# Patient Record
Sex: Male | Born: 1960 | Race: Black or African American | Hispanic: No | Marital: Single
Health system: Southern US, Community
[De-identification: ages and names within clinical notes are randomized; demographics above are authoritative.]

## PROBLEM LIST (undated history)

## (undated) DIAGNOSIS — F209 Schizophrenia, unspecified: Secondary | ICD-10-CM

## (undated) DIAGNOSIS — I10 Essential (primary) hypertension: Secondary | ICD-10-CM

## (undated) HISTORY — PX: APPENDECTOMY: SHX54

---

## 2011-10-06 DIAGNOSIS — F911 Conduct disorder, childhood-onset type: Secondary | ICD-10-CM | POA: Insufficient documentation

## 2011-10-06 DIAGNOSIS — F17200 Nicotine dependence, unspecified, uncomplicated: Secondary | ICD-10-CM | POA: Insufficient documentation

## 2011-10-07 ENCOUNTER — Encounter (HOSPITAL_COMMUNITY): Payer: Self-pay

## 2011-10-07 ENCOUNTER — Emergency Department (HOSPITAL_COMMUNITY)
Admission: EM | Admit: 2011-10-07 | Discharge: 2011-10-07 | Disposition: A | Discharge status: Home / Self Care | Payer: Medicare Other | Attending: Emergency Medicine | Admitting: Emergency Medicine

## 2011-10-07 DIAGNOSIS — R454 Irritability and anger: Secondary | ICD-10-CM

## 2011-10-07 HISTORY — DX: Schizophrenia, unspecified: F20.9

## 2011-10-07 LAB — COMPREHENSIVE METABOLIC PANEL
ALT: 13 U/L (ref 0–53)
AST: 19 U/L (ref 0–37)
Alkaline Phosphatase: 128 U/L — ABNORMAL HIGH (ref 39–117)
CO2: 26 mEq/L (ref 19–32)
Calcium: 9.9 mg/dL (ref 8.4–10.5)
Chloride: 97 mEq/L (ref 96–112)
GFR calc Af Amer: >90 mL/min (ref >90)
GFR calc non Af Amer: >90 mL/min (ref >90)
Glucose, Bld: 118 mg/dL — ABNORMAL HIGH (ref 70–99)
Sodium: 134 mEq/L — ABNORMAL LOW (ref 135–145)
Total Bilirubin: 0.3 mg/dL (ref 0.3–1.2)

## 2011-10-07 LAB — CBC
Hemoglobin: 13.6 g/dL (ref 13.0–17.0)
MCH: 26.3 pg (ref 26.0–34.0)
Platelets: 396 10*3/uL (ref 150–400)
RBC: 5.17 MIL/uL (ref 4.22–5.81)
WBC: 10.1 10*3/uL (ref 4.0–10.5)

## 2011-10-07 LAB — RAPID URINE DRUG SCREEN, HOSP PERFORMED
Amphetamines: NOT DETECTED
Barbiturates: NOT DETECTED
Opiates: NOT DETECTED
Tetrahydrocannabinol: NOT DETECTED

## 2011-10-07 NOTE — ED Notes (Signed)
Pt is getting a ride from [employer] to leave,  but now pt has IVC papers taken out by his mother. Pt. will not be able to leave the facility.
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
## 2011-10-07 NOTE — ED Provider Notes (Signed)
History     CSN: [id number]  Arrival date & time 10/06/11  2313   First MD Initiated Contact with Patient 10/07/11 0044      Chief Complaint  Patient presents with  . Medical Clearance    (Consider location/radiation/quality/duration/timing/severity/associated sxs/prior treatment) The history is provided by the patient.   51 year old male states that he got into an argument with his mother. He is angry about being disrespected in his own home. He denies making any threats to anyone. He denies homicidal or suicidal ideation. He denies depression. He states that he really does need some resources for outpatient counseling. He denies hallucinations.  No past medical history on file.  No past surgical history on file.  No family history on file.  History  Substance Use Topics  . Smoking status: Current Everyday Smoker  . Smokeless tobacco: Not on file  . Alcohol Use: No      Review of Systems  All other systems reviewed and are negative.    Allergies  Review of patient's allergies indicates no known allergies.  Home Medications  No current outpatient prescriptions on file.  BP 150/88  Pulse 126  Temp 98.3 F (36.8 C) (Oral)  SpO2 95%  Physical Exam  Nursing note and vitals reviewed. -year-old male comfortable in no acute distress. Vital signs are significant for tachycardia with heart rate of 126, and hypertension with blood pressure 150/88. Oxygen saturation is 95% which is normal. Head is normocephalic and atraumatic. PERRLA, EOMI. Supple. Back is nontender. Lungs are clear without rales, wheezes, rhonchi. Heart has regular rate rhythm without murmur. Abdomen is soft, flat, nontender without masses or hepatosplenomegaly. Jimmy's have no cyanosis or edema, full range of motion is present. Rash. Neurologic: Mental status is normal, cranial nerves are intact, there are no motor or sensory deficits.  ED Course  Procedures (including critical care time)  Results for  orders placed during the hospital encounter of 10/07/11  CBC      Component Value Range   WBC 10.1  4.0 - 10.5 K/uL   RBC 5.17  4.22 - 5.81 MIL/uL   Hemoglobin 13.6  13.0 - 17.0 g/dL   HCT 47.8  29.5 - 62.1 %   MCV 79.3  78.0 - 100.0 fL   MCH 26.3  26.0 - 34.0 pg   MCHC 33.2  30.0 - 36.0 g/dL   RDW 30.8  65.7 - 84.6 %   Platelets 396  150 - 400 K/uL  COMPREHENSIVE METABOLIC PANEL      Component Value Range   Sodium 134 (*) 135 - 145 mEq/L   Potassium 3.8  3.5 - 5.1 mEq/L   Chloride 97  96 - 112 mEq/L   CO2 26  19 - 32 mEq/L   Glucose, Bld 118 (*) 70 - 99 mg/dL   BUN 10  6 - 23 mg/dL   Creatinine, Ser 9.62  0.50 - 1.35 mg/dL   Calcium 9.9  8.4 - 95.2 mg/dL   Total Protein 8.5 (*) 6.0 - 8.3 g/dL   Albumin 4.1  3.5 - 5.2 g/dL   AST 19  0 - 37 U/L   ALT 13  0 - 53 U/L   Alkaline Phosphatase 128 (*) 39 - 117 U/L   Total Bilirubin 0.3  0.3 - 1.2 mg/dL   GFR calc non Af Amer >90  >90 mL/min   GFR calc Af Amer >90  >90 mL/min  ETHANOL      Component Value Range  Alcohol, Ethyl (B) <11  0 - 11 mg/dL  ACETAMINOPHEN LEVEL      Component Value Range   Acetaminophen (Tylenol), Serum <15.0  10 - 30 ug/mL  URINE RAPID DRUG SCREEN (HOSP PERFORMED)      Component Value Range   Opiates NONE DETECTED  NONE DETECTED   Cocaine NONE DETECTED  NONE DETECTED   Benzodiazepines NONE DETECTED  NONE DETECTED   Amphetamines NONE DETECTED  NONE DETECTED   Tetrahydrocannabinol NONE DETECTED  NONE DETECTED   Barbiturates NONE DETECTED  NONE DETECTED    Date: 10/07/2011  Rate: 115  Rhythm: sinus tachycardia  QRS Axis: normal  Intervals: normal  ST/T Wave abnormalities: nonspecific T wave changes  Conduction Disutrbances:none  Narrative Interpretation: Sinus tachycardia, left atrial hypertrophy, nonspecific T wave changes. No old ECG available for comparison.  Old EKG Reviewed: none available   1. Anger reaction       MDM  Situational anxiety without evidence of psychosis. No homicidal  or suicidal ideation. He need for hospitalization. Patient is requesting referral for counseling resources, so consultation will be obtained with ACT Team.        Dione Booze, MD 10/07/11 [phone]
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
## 2011-10-07 NOTE — ED Notes (Signed)
Pt's mother called and states that she is not comfortable for pt to return home in the state he is currently in Mother states pt told her he doesn't want her other children at the house and that it is his house Mother states pt is schizophrenic and is not acting per his norm and she feels it is unsafe for the pt to return at this time  EDP notified  Order for social service consult obtained

## 2011-10-07 NOTE — ED Notes (Signed)
Pt denies SI/HI reports "just got mad at my family.  Family informed officer that he had not been taking his medication- Pt is unsure of medications and poor historian

## 2011-10-07 NOTE — ED Notes (Signed)
RN spoke with Dr Preston Fleeting regarding IVC papers, Dr Preston Fleeting states pt has been cleared (both medical and psy) [employer] officer trying to verify pt can be discharged and papers resinded.

## 2014-11-12 ENCOUNTER — Ambulatory Visit
Admission: RE | Admit: 2014-11-12 | Discharge: 2014-11-12 | Disposition: A | Discharge status: Home / Self Care | Payer: Medicare Other | Source: Ambulatory Visit | Attending: Internal Medicine | Admitting: Internal Medicine

## 2014-11-12 ENCOUNTER — Other Ambulatory Visit: Payer: Self-pay | Admitting: Internal Medicine

## 2014-11-12 DIAGNOSIS — J40 Bronchitis, not specified as acute or chronic: Secondary | ICD-10-CM

## 2015-05-05 ENCOUNTER — Encounter (HOSPITAL_COMMUNITY): Payer: Self-pay

## 2015-05-05 ENCOUNTER — Emergency Department (HOSPITAL_COMMUNITY)
Admission: EM | Admit: 2015-05-05 | Discharge: 2015-05-10 | Disposition: A | Discharge status: Home / Self Care | Payer: Medicare Other | Attending: Emergency Medicine | Admitting: Emergency Medicine

## 2015-05-05 DIAGNOSIS — G4700 Insomnia, unspecified: Secondary | ICD-10-CM | POA: Insufficient documentation

## 2015-05-05 DIAGNOSIS — F17200 Nicotine dependence, unspecified, uncomplicated: Secondary | ICD-10-CM | POA: Diagnosis not present

## 2015-05-05 DIAGNOSIS — F209 Schizophrenia, unspecified: Secondary | ICD-10-CM | POA: Insufficient documentation

## 2015-05-05 DIAGNOSIS — Z79899 Other long term (current) drug therapy: Secondary | ICD-10-CM | POA: Insufficient documentation

## 2015-05-05 DIAGNOSIS — F419 Anxiety disorder, unspecified: Secondary | ICD-10-CM | POA: Insufficient documentation

## 2015-05-05 DIAGNOSIS — Z9119 Patient's noncompliance with other medical treatment and regimen: Secondary | ICD-10-CM | POA: Diagnosis not present

## 2015-05-05 DIAGNOSIS — Z7982 Long term (current) use of aspirin: Secondary | ICD-10-CM | POA: Insufficient documentation

## 2015-05-05 DIAGNOSIS — Z9114 Patient's other noncompliance with medication regimen: Secondary | ICD-10-CM

## 2015-05-05 DIAGNOSIS — F919 Conduct disorder, unspecified: Secondary | ICD-10-CM | POA: Diagnosis present

## 2015-05-05 DIAGNOSIS — R4689 Other symptoms and signs involving appearance and behavior: Secondary | ICD-10-CM

## 2015-05-05 LAB — COMPREHENSIVE METABOLIC PANEL WITH GFR
ALT: 20 U/L (ref 17–63)
AST: 92 U/L — ABNORMAL HIGH (ref 15–41)
Albumin: 3.7 g/dL (ref 3.5–5.0)
Alkaline Phosphatase: 78 U/L (ref 38–126)
Anion gap: 8 (ref 5–15)
BUN: 19 mg/dL (ref 6–20)
CO2: 28 mmol/L (ref 22–32)
Calcium: 9 mg/dL (ref 8.9–10.3)
Chloride: 102 mmol/L (ref 101–111)
Creatinine, Ser: 1 mg/dL (ref 0.61–1.24)
GFR calc Af Amer: >60 mL/min
GFR calc non Af Amer: >60 mL/min
Glucose, Bld: 112 mg/dL — ABNORMAL HIGH (ref 65–99)
Potassium: 4 mmol/L (ref 3.5–5.1)
Sodium: 138 mmol/L (ref 135–145)
Total Bilirubin: 0.5 mg/dL (ref 0.3–1.2)
Total Protein: 8.2 g/dL — ABNORMAL HIGH (ref 6.5–8.1)

## 2015-05-05 LAB — SALICYLATE LEVEL

## 2015-05-05 LAB — CBC
HCT: 44.2 % (ref 39.0–52.0)
Hemoglobin: 14.2 g/dL (ref 13.0–17.0)
MCH: 26.9 pg (ref 26.0–34.0)
MCHC: 32.1 g/dL (ref 30.0–36.0)
MCV: 83.9 fL (ref 78.0–100.0)
PLATELETS: 435 10*3/uL — AB (ref 150–400)
RBC: 5.27 MIL/uL (ref 4.22–5.81)
RDW: 14.7 % (ref 11.5–15.5)
WBC: 8 10*3/uL (ref 4.0–10.5)

## 2015-05-05 LAB — ETHANOL: Alcohol, Ethyl (B): <5 mg/dL

## 2015-05-05 LAB — ACETAMINOPHEN LEVEL: Acetaminophen (Tylenol), Serum: <10 ug/mL — ABNORMAL LOW (ref 10–30)

## 2015-05-05 MED ORDER — ASPIRIN 81 MG PO CHEW
81.0000 mg | CHEWABLE_TABLET | Freq: Every day | ORAL | Status: DC
  Administered 2015-05-05 – 2015-05-10 (×6): 81 mg via ORAL
  Filled 2015-05-05 (×6): qty 1

## 2015-05-05 MED ORDER — LISINOPRIL 10 MG PO TABS
10.0000 mg | ORAL_TABLET | Freq: Every day | ORAL | Status: DC
  Administered 2015-05-06 – 2015-05-10 (×5): 10 mg via ORAL
  Filled 2015-05-05 (×5): qty 1
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
## 2015-05-05 NOTE — ED Notes (Signed)
Bed: WBH38 Expected date:  Expected time:  Means of arrival:  Comments: Triage 3 
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
## 2015-05-05 NOTE — ED Notes (Signed)
Pt AAO x 3, resting at present, no distress noted, calm & cooperative, denies SI, HI or AVH.  Monitoring for safety, Q 15 min checks in effect.
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
## 2015-05-05 NOTE — ED Notes (Signed)
Pt has been changed into scrubs and wanded by Security.  
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
## 2015-05-06 DIAGNOSIS — F209 Schizophrenia, unspecified: Secondary | ICD-10-CM

## 2015-05-06 LAB — RAPID URINE DRUG SCREEN, HOSP PERFORMED
AMPHETAMINES: NOT DETECTED
BARBITURATES: NOT DETECTED
Benzodiazepines: NOT DETECTED
Cocaine: NOT DETECTED
Opiates: NOT DETECTED
Tetrahydrocannabinol: NOT DETECTED

## 2015-05-06 MED ORDER — RISPERIDONE 1 MG PO TABS
1.0000 mg | ORAL_TABLET | Freq: Every day | ORAL | Status: DC
  Administered 2015-05-06 – 2015-05-09 (×4): 1 mg via ORAL
  Filled 2015-05-06 (×4): qty 1

## 2015-05-06 NOTE — ED Notes (Signed)
Pt is pleasant and cooperative.  He denies S/I, H/I. And AVH.  He is mostly withdrawn and sleeping today. 15 minute checks and video monitoring continue.
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
## 2015-05-06 NOTE — Consult Note (Signed)
**Note Paul-Identified via Obfuscation** [facility] Consult   Reason for Consult:  Psychiatric Evaluation Referring Physician:  EDP Patient Identification: Paul Mejia MRN:  [id number] Principal Diagnosis: Schizophrenia Diagnosis:  There are no active problems to display for this patient.   Total Time spent with patient: 45 minutes  Subjective:   Paul Mejia is a 55 y.o. male patient who states "I have not been taking my medications. I don't feel right taking them."  HPI:  Paul Mejia is a 55 year old African-American male who presented to [facility] emergency department after he was placed under involuntary commitment by his family due to being off this medication for schizophrenia. Family states that since been off his medication. He has not been sleeping and has poor personal hygiene. His family is concerned about him not taking his medications. Per the IVC paperwork, the patient has been aggressive with family members, throwing things waking up startled and swinging at people.  Today, the patient states that he is supposed to be on medication for schizophrenia. However, he does not like taking the medicine, he reports he has been on medication for 21 years and "I do not feel like I should be on medicine for the rest of my life." States he has taken Risperdal and Haldol in the past but has not taken any of his medication in the past 6-8 months. Patient denies any aggressive behavior that was referenced by the family, stating "I don't bother nobody."  Today he denies suicidal ideation, intent or plan. He denies homicidal ideation, intent and plan. He does endorse visual and auditory hallucinations.  Past Psychiatric History: Schizophrenia  Risk to Self: Suicidal Ideation: No Suicidal Intent: No Is patient at risk for suicide?: No Suicidal Plan?: No Access to Means: No What has been your use of drugs/alcohol within the last 12 months?: None Reported How many times?: 0 Other Self Harm Risks: None  Reported Intentional Self Injurious Behavior: None Risk to Others: Homicidal Ideation: No Thoughts of Harm to Others: No Current Homicidal Intent: No Current Homicidal Plan: No Access to Homicidal Means: No Identified Victim: None Reported History of harm to others?: No Assessment of Violence: None Noted Violent Behavior Description: None  Does patient have access to weapons?: No Criminal Charges Pending?: No Does patient have a court date: No Prior Inpatient Therapy: Prior Inpatient Therapy: Yes Prior Therapy Dates: 10 years ago Prior Therapy Facilty/Provider(s): Unable to remember Reason for Treatment: Unable to remember Prior Outpatient Therapy: Prior Outpatient Therapy: No Prior Therapy Dates: NA Prior Therapy Facilty/Provider(s): NA Reason for Treatment: NA Does patient have an ACCT team?: No Does patient have Intensive In-House Services?  : No Does patient have [facility] services? : No Does patient have P4CC services?: No  Past Medical History:  Past Medical History  Diagnosis Date  . Schizophrenia ([place])    History reviewed. No pertinent past surgical history. Family History: History reviewed. No pertinent family history. Family Psychiatric  History: Unknown Social History:  History  Alcohol Use No     History  Drug Use No    Social History   Social History  . Marital Status: Single    Spouse Name: N/A  . Number of Children: N/A  . Years of Education: N/A   Social History Main Topics  . Smoking status: Current Every Day Smoker  . Smokeless tobacco: None  . Alcohol Use: No  . Drug Use: No  . Sexual Activity: Not Asked   Other Topics Concern  . None   Social History Narrative  Additional Social History:    Allergies:  No Known Allergies  Labs:  Results for orders placed or performed during the hospital encounter of 05/05/15 (from the past 48 hour(s))  Comprehensive metabolic panel     Status: Abnormal   Collection Time: 05/05/15  4:57 PM   Result Value Ref Range   Sodium 138 135 - 145 mmol/L   Potassium 4.0 3.5 - 5.1 mmol/L   Chloride 102 101 - 111 mmol/L   CO2 28 22 - 32 mmol/L   Glucose, Bld 112 (H) 65 - 99 mg/dL   BUN 19 6 - 20 mg/dL   Creatinine, Ser 1.00 0.61 - 1.24 mg/dL   Calcium 9.0 8.9 - 10.3 mg/dL   Total Protein 8.2 (H) 6.5 - 8.1 g/dL   Albumin 3.7 3.5 - 5.0 g/dL   AST 92 (H) 15 - 41 U/L   ALT 20 17 - 63 U/L   Alkaline Phosphatase 78 38 - 126 U/L   Total Bilirubin 0.5 0.3 - 1.2 mg/dL   GFR calc non Af Amer >60 >60 mL/min   GFR calc Af Amer >60 >60 mL/min    Comment: (NOTE) The eGFR has been calculated using the CKD EPI equation. This calculation has not been validated in all clinical situations. eGFR's persistently <60 mL/min signify possible Chronic Kidney Disease.    Anion gap 8 5 - 15  Ethanol (ETOH)     Status: None   Collection Time: 05/05/15  4:57 PM  Result Value Ref Range   Alcohol, Ethyl (B) <5 <5 mg/dL    Comment:        LOWEST DETECTABLE LIMIT FOR SERUM ALCOHOL IS 5 mg/dL FOR MEDICAL PURPOSES ONLY   Salicylate level     Status: None   Collection Time: 05/05/15  4:57 PM  Result Value Ref Range   Salicylate Lvl <1.2 2.8 - 30.0 mg/dL  Acetaminophen level     Status: Abnormal   Collection Time: 05/05/15  4:57 PM  Result Value Ref Range   Acetaminophen (Tylenol), Serum <10 (L) 10 - 30 ug/mL    Comment:        THERAPEUTIC CONCENTRATIONS VARY SIGNIFICANTLY. A RANGE OF 10-30 ug/mL MAY BE AN EFFECTIVE CONCENTRATION FOR MANY PATIENTS. HOWEVER, SOME ARE BEST TREATED AT CONCENTRATIONS OUTSIDE THIS RANGE. ACETAMINOPHEN CONCENTRATIONS >150 ug/mL AT 4 HOURS AFTER INGESTION AND >50 ug/mL AT 12 HOURS AFTER INGESTION ARE OFTEN ASSOCIATED WITH TOXIC REACTIONS.   CBC     Status: Abnormal   Collection Time: 05/05/15  4:57 PM  Result Value Ref Range   WBC 8.0 4.0 - 10.5 K/uL   RBC 5.27 4.22 - 5.81 MIL/uL   Hemoglobin 14.2 13.0 - 17.0 g/dL   HCT 44.2 39.0 - 52.0 %   MCV 83.9 78.0 - 100.0  fL   MCH 26.9 26.0 - 34.0 pg   MCHC 32.1 30.0 - 36.0 g/dL   RDW 14.7 11.5 - 15.5 %   Platelets 435 (H) 150 - 400 K/uL  Urine rapid drug screen (hosp performed) (Not at [facility])     Status: None   Collection Time: 05/06/15  6:00 AM  Result Value Ref Range   Opiates NONE DETECTED NONE DETECTED   Cocaine NONE DETECTED NONE DETECTED   Benzodiazepines NONE DETECTED NONE DETECTED   Amphetamines NONE DETECTED NONE DETECTED   Tetrahydrocannabinol NONE DETECTED NONE DETECTED   Barbiturates NONE DETECTED NONE DETECTED    Comment:        DRUG SCREEN FOR MEDICAL PURPOSES  ONLY.  IF CONFIRMATION IS NEEDED FOR ANY PURPOSE, NOTIFY LAB WITHIN 5 DAYS.        LOWEST DETECTABLE LIMITS FOR URINE DRUG SCREEN Drug Class       Cutoff (ng/mL) Amphetamine      1000 Barbiturate      200 Benzodiazepine   322 Tricyclics       025 Opiates          300 Cocaine          300 THC              50     Current Facility-Administered Medications  Medication Dose Route Frequency Provider Last Rate Last Dose  . aspirin chewable tablet 81 mg  81 mg Oral Daily Leo Grosser, MD   81 mg at 05/06/15 1037  . lisinopril (PRINIVIL,ZESTRIL) tablet 10 mg  10 mg Oral Daily Leo Grosser, MD   10 mg at 05/06/15 1037   Current Outpatient Prescriptions  Medication Sig Dispense Refill  . aspirin 81 MG chewable tablet Chew 81 mg by mouth daily.    Marland Kitchen lisinopril (PRINIVIL,ZESTRIL) 10 MG tablet Take 10 mg by mouth daily.    . simvastatin (ZOCOR) 20 MG tablet Take 20 mg by mouth daily.       Musculoskeletal: Strength & Muscle Tone: within normal limits Gait & Station: normal Patient leans: N/A  Psychiatric Specialty Exam: Review of Systems  Constitutional: Negative.   HENT: Negative.   Eyes: Negative.   Respiratory: Negative.   Cardiovascular: Negative.   Gastrointestinal: Negative.   Genitourinary: Negative.   Musculoskeletal: Negative.   Skin: Negative.   Endo/Heme/Allergies: Negative.   Psychiatric/Behavioral:  Positive for hallucinations.    Blood pressure 115/78, pulse 82, temperature 98.1 F (36.7 C), temperature source Oral, resp. rate 16, SpO2 98 %.There is no height or weight on file to calculate BMI.  General Appearance: Disheveled  Eye Sport and exercise psychologist::  Fair  Speech:  Clear and Coherent and Normal Rate  Volume:  Normal  Mood:  Anxious  Affect:  Congruent  Thought Process:  Coherent  Orientation:  Full (Time, Place, and Person)  Thought Content:  Hallucinations: Auditory Visual  Suicidal Thoughts:  No  Homicidal Thoughts:  No  Memory:  Immediate;   Fair Recent;   Fair Remote;   Fair  Judgement:  Fair  Insight:  Fair  Psychomotor Activity:  Normal  Concentration:  Fair  Recall:  [employer] of Knowledge:Fair  Language: Fair  Akathisia:  No  Handed:  Right  AIMS (if indicated):     Assets:  Communication Skills Physical Health Resilience  ADL's:  Intact  Cognition: WNL  Sleep:      Treatment Plan Summary: Daily contact with patient to assess and evaluate symptoms and progress in treatment and Medication management  -Start Risperdal 1 mg PO QHS for schizophrenia  Disposition: Recommend psychiatric Inpatient admission when medically cleared.  Lurena Nida, NP 05/06/2015 3:30 PM

## 2015-05-06 NOTE — ED Notes (Signed)
Patient received calm and resting in bed. Patient denies SI/ HI, A/ V H, and pain at this time. Patient reports having slept. Patient reports no needs at this time. Monitoring for safety, 15 min checks continue.

## 2015-05-07 DIAGNOSIS — F209 Schizophrenia, unspecified: Secondary | ICD-10-CM | POA: Diagnosis not present

## 2015-05-07 NOTE — ED Notes (Signed)
Report received from Diane RN. Pt. Sleeping with respirations regular and unlabored. Will continue to monitor for safety via security cameras and Q 15 minute checks. 
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
## 2015-05-07 NOTE — ED Notes (Signed)
Pt has been in bed sleeping all day. He becomes alert when someone comes into his room, and is pleasant and cooperative. Denies SI/HI.

## 2015-05-07 NOTE — ED Notes (Signed)
Pt. Noted sleeping in room. No complaints or concerns voiced. No distress or abnormal behavior noted. Will continue to monitor with security cameras. Q 15 minute rounds continue. 

## 2015-05-08 DIAGNOSIS — F209 Schizophrenia, unspecified: Secondary | ICD-10-CM | POA: Diagnosis present

## 2015-05-08 NOTE — ED Notes (Signed)
Pt. Noted sleeping in room. No complaints or concerns voiced. No distress or abnormal behavior noted. Will continue to monitor with security cameras. Q 15 minute rounds continue. 

## 2015-05-08 NOTE — Progress Notes (Signed)
Disposition CSW completed patient referrals to the following inpatient psych facilities:  [facility]  CSW will follow up with patient as needed for placement.  Seward SpeckLeo Cutberto Winfree Swain Community HospitalCSW,LCAS Behavioral Health Disposition CSW [phone]

## 2015-05-08 NOTE — ED Notes (Signed)
Snack and beverage given. 

## 2015-05-08 NOTE — ED Notes (Signed)
Up to the bathroom 

## 2015-05-08 NOTE — ED Notes (Signed)
Pt. Noted in room. No complaints or concerns voiced. No distress or abnormal behavior noted. Will continue to monitor with security cameras. Q 15 minute rounds continue. 

## 2015-05-08 NOTE — ED Notes (Signed)
Report received from Janie Rambo RN. Pt. Alert and oriented in no distress denies SI, HI, AVH and pain.  Pt. Instructed to come to me with problems or concerns.Will continue to monitor for safety via security cameras and Q 15 minute checks. 

## 2015-05-08 NOTE — Progress Notes (Signed)
Reassessment:  Patient denies SI/HI, A/VH, and other self-injurious behaviors.  Patient reports compliance with medications and unit rules.  Patient reports sleeping and eating good.     Paul Mejia, MSW, Clare CharonLCSW, LCAS [facility] Triage Specialist [phone]

## 2015-05-09 ENCOUNTER — Encounter (HOSPITAL_COMMUNITY): Payer: Self-pay | Admitting: Registered Nurse

## 2015-05-09 DIAGNOSIS — F209 Schizophrenia, unspecified: Secondary | ICD-10-CM | POA: Diagnosis not present
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
## 2015-05-09 NOTE — ED Notes (Signed)
Pt. Noted sleeping in room. No complaints or concerns voiced. No distress or abnormal behavior noted. Will continue to monitor with security cameras. Q 15 minute rounds continue. 

## 2015-05-09 NOTE — ED Notes (Signed)
Up to the bathroom 

## 2015-05-09 NOTE — ED Notes (Signed)
Dr J and Shovon NP into see 

## 2015-05-09 NOTE — ED Notes (Signed)
Pt. Noted in room. No complaints or concerns voiced. No distress or abnormal behavior noted. Will continue to monitor with security cameras. Q 15 minute rounds continue. 

## 2015-05-09 NOTE — ED Notes (Signed)
Snack and beverage given. 

## 2015-05-09 NOTE — ED Notes (Signed)
Pt reports that he is going got be dc'd today.  Pt reports that he feels safe to leave.

## 2015-05-09 NOTE — ED Notes (Signed)
Pt. In the shower .

## 2015-05-09 NOTE — ED Notes (Signed)
Report received from Janie Rambo RN. Pt. Alert and oriented in no distress denies SI, HI, AVH and pain.  Pt. Instructed to come to me with problems or concerns.Will continue to monitor for safety via security cameras and Q 15 minute checks. 

## 2015-05-10 DIAGNOSIS — F209 Schizophrenia, unspecified: Secondary | ICD-10-CM | POA: Diagnosis present

## 2015-05-10 NOTE — ED Notes (Signed)
Pt. Noted sleeping in room. No complaints or concerns voiced. No distress or abnormal behavior noted. Will continue to monitor with security cameras. Q 15 minute rounds continue. 

## 2015-05-10 NOTE — [facility] Suicide Risk Assessment (Signed)
Suicide Risk Assessment  Discharge Assessment   [facility]   Principal Problem: Schizophrenia, unspecified [facility](HCC) Discharge Diagnoses:  Patient Active Problem List   Diagnosis Date Noted  . Schizophrenia, unspecified (HCC) [F20.9] 05/10/2015    Priority: High  . Schizophrenia (HCC) [F20.9]     Total Time spent with patient: 30 minutes   Musculoskeletal: Strength & Muscle Tone: within normal limits Gait & Station: normal Patient leans: N/A  Psychiatric Specialty Exam: Review of Systems  Constitutional: Negative.   HENT: Negative.   Eyes: Negative.   Respiratory: Negative.   Cardiovascular: Negative.   Gastrointestinal: Negative.   Genitourinary: Negative.   Musculoskeletal: Negative.   Skin: Negative.   Neurological: Negative.   Endo/Heme/Allergies: Negative.   Psychiatric/Behavioral: Negative.     Blood pressure 116/79, pulse 98, temperature 97.5 F (36.4 C), temperature source Oral, resp. rate 16, SpO2 100 %.There is no height or weight on file to calculate BMI.  General Appearance: Casual  Eye Contact::  Good  Speech:  Normal Rate  Volume:  Normal  Mood:  Euthymic  Affect:  Congruent  Thought Process:  Coherent  Orientation:  Full (Time, Place, and Person)  Thought Content:  WDL  Suicidal Thoughts:  No  Homicidal Thoughts:  No  Memory:  Immediate;   Good Recent;   Good Remote;   Good  Judgement:  Good  Insight:  Fair  Psychomotor Activity:  Normal  Concentration:  Good  Recall:  Good  Fund of Knowledge:Good  Language: Good  Akathisia:  No  Handed:  Right  AIMS (if indicated):     Assets:  Housing Leisure Time Physical Health Resilience Social Support  ADL's:  Intact  Cognition: WNL  Sleep:       Mental Status Per Nursing Assessment::   On Admission:   Paranois  Demographic Factors:  Male  Loss Factors: NA  Historical Factors: Family history of mental illness or substance abuse  Risk Reduction Factors:    Sense of responsibility to family, Living with another person, especially a relative, Positive social support and Positive therapeutic relationship  Continued Clinical Symptoms:  None  Cognitive Features That Contribute To Risk:  None    Suicide Risk:  Minimal: No identifiable suicidal ideation.  Patients presenting with no risk factors but with morbid ruminations; may be classified as minimal risk based on the severity of the depressive symptoms  Follow-up Information    Schedule an appointment as soon as possible for a visit with [facility].   Specialty:  Behavioral Health   Why:  Tamela OddiJo Hughes   Contact information:   [address] [place] [address] [phone]       Plan Of Care/Follow-up recommendations:  Activity:  as tolerated Diet:  heart healthy diet  LORD, JAMISON, NP 05/10/2015, 2:14 PM

## 2015-05-10 NOTE — BH Assessment (Signed)
[facility] Assessment Progress Note  Per Thedore MinsMojeed Akintayo, MD, this pt does not require psychiatric hospitalization at this time.  Pt presents under IVC initiated by his sister, Herma CarsonSharon Moody, and upheld by Carolanne GrumblingGerald Taylor, MD.  [facility] to follow up with his outpatient provider at [facility].  IVC has been rescinded.  Referral information for [facility] has been included in pt's discharge instructions.  Pt's nurse, Kendal Hymendie, has been notified.  Doylene Canninghomas Taneya Conkel, MA Triage Specialist [phone]

## 2015-05-10 NOTE — Discharge Instructions (Signed)
For your ongoing behavioral health needs, you are advised to follow up with your current provider at [facility]: ° °     [facility] °     [address] °     , [place] [address] °     [phone] °

## 2015-05-10 NOTE — ED Notes (Signed)
Pt discharged ambulatory.  All belongings were returned to patient and discharge instructions were reviewed.
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
## 2015-09-07 ENCOUNTER — Encounter (HOSPITAL_COMMUNITY): Payer: Self-pay | Admitting: *Deleted

## 2015-09-07 ENCOUNTER — Emergency Department (HOSPITAL_COMMUNITY)
Admission: EM | Admit: 2015-09-07 | Discharge: 2015-09-08 | Disposition: A | Discharge status: Rehabilitation Facility | Payer: Medicare Other | Attending: Emergency Medicine | Admitting: Emergency Medicine

## 2015-09-07 ENCOUNTER — Emergency Department (HOSPITAL_COMMUNITY): Payer: Medicare Other

## 2015-09-07 DIAGNOSIS — Z5181 Encounter for therapeutic drug level monitoring: Secondary | ICD-10-CM | POA: Insufficient documentation

## 2015-09-07 DIAGNOSIS — F200 Paranoid schizophrenia: Secondary | ICD-10-CM

## 2015-09-07 DIAGNOSIS — R45850 Homicidal ideations: Secondary | ICD-10-CM | POA: Diagnosis present

## 2015-09-07 DIAGNOSIS — M549 Dorsalgia, unspecified: Secondary | ICD-10-CM | POA: Insufficient documentation

## 2015-09-07 DIAGNOSIS — F17200 Nicotine dependence, unspecified, uncomplicated: Secondary | ICD-10-CM | POA: Diagnosis not present

## 2015-09-07 LAB — COMPREHENSIVE METABOLIC PANEL
ALK PHOS: 92 U/L (ref 38–126)
ALT: 15 U/L — ABNORMAL LOW (ref 17–63)
ANION GAP: 17 — AB (ref 5–15)
AST: 26 U/L (ref 15–41)
Albumin: 4.3 g/dL (ref 3.5–5.0)
BILIRUBIN TOTAL: 0.6 mg/dL (ref 0.3–1.2)
BUN: 8 mg/dL (ref 6–20)
CALCIUM: 9.1 mg/dL (ref 8.9–10.3)
CO2: 16 mmol/L — ABNORMAL LOW (ref 22–32)
Chloride: 100 mmol/L — ABNORMAL LOW (ref 101–111)
Creatinine, Ser: 1.47 mg/dL — ABNORMAL HIGH (ref 0.61–1.24)
GFR calc Af Amer: >60 mL/min (ref >60)
GFR, EST NON AFRICAN AMERICAN: 52 mL/min — AB (ref >60)
Glucose, Bld: 145 mg/dL — ABNORMAL HIGH (ref 65–99)
POTASSIUM: 3.7 mmol/L (ref 3.5–5.1)
Sodium: 133 mmol/L — ABNORMAL LOW (ref 135–145)
TOTAL PROTEIN: 8.6 g/dL — AB (ref 6.5–8.1)

## 2015-09-07 LAB — URINALYSIS, ROUTINE W REFLEX MICROSCOPIC
BILIRUBIN URINE: NEGATIVE
GLUCOSE, UA: NEGATIVE mg/dL
KETONES UR: NEGATIVE mg/dL
LEUKOCYTES UA: NEGATIVE
Nitrite: NEGATIVE
PH: 6 (ref 5.0–8.0)
Protein, ur: 100 mg/dL — AB
Specific Gravity, Urine: 1.014 (ref 1.005–1.030)

## 2015-09-07 LAB — CBC
HEMATOCRIT: 46.5 % (ref 39.0–52.0)
Hemoglobin: 15.5 g/dL (ref 13.0–17.0)
MCH: 26.9 pg (ref 26.0–34.0)
MCHC: 33.3 g/dL (ref 30.0–36.0)
MCV: 80.6 fL (ref 78.0–100.0)
PLATELETS: 441 10*3/uL — AB (ref 150–400)
RBC: 5.77 MIL/uL (ref 4.22–5.81)
RDW: 14.8 % (ref 11.5–15.5)
WBC: 8.7 10*3/uL (ref 4.0–10.5)

## 2015-09-07 LAB — URINE MICROSCOPIC-ADD ON

## 2015-09-07 LAB — CK: Total CK: 174 U/L (ref 49–397)

## 2015-09-07 LAB — RAPID URINE DRUG SCREEN, HOSP PERFORMED
Amphetamines: NOT DETECTED
BARBITURATES: NOT DETECTED
BENZODIAZEPINES: NOT DETECTED
Cocaine: NOT DETECTED
Opiates: NOT DETECTED
Tetrahydrocannabinol: NOT DETECTED

## 2015-09-07 LAB — SALICYLATE LEVEL: Salicylate Lvl: <4 mg/dL (ref 2.8–30.0)

## 2015-09-07 LAB — TROPONIN I

## 2015-09-07 LAB — ETHANOL

## 2015-09-07 LAB — ACETAMINOPHEN LEVEL

## 2015-09-07 MED ORDER — SODIUM CHLORIDE 0.9 % IV BOLUS (SEPSIS)
2000.0000 mL | Freq: Once | INTRAVENOUS | Status: AC
  Administered 2015-09-07: 2000 mL via INTRAVENOUS

## 2015-09-07 MED ORDER — MORPHINE SULFATE (PF) 4 MG/ML IV SOLN
4.0000 mg | Freq: Once | INTRAVENOUS | Status: DC
Start: 2015-09-07 — End: 2015-09-07
  Filled 2015-09-07: qty 1

## 2015-09-07 MED ORDER — IBUPROFEN 200 MG PO TABS
400.0000 mg | ORAL_TABLET | Freq: Once | ORAL | Status: AC
  Administered 2015-09-07: 400 mg via ORAL
  Filled 2015-09-07: qty 2
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
## 2015-09-07 NOTE — ED Notes (Signed)
Pt put in scrubs and belongings are at the nurse's station, pt and belongings have been searched and wanded. Belongings consists of white t shirt, black belt, khaki shorts, black wallet with $1 inside, white and blue socks, and black tennis shoes. Small biohazard bag consisting of his ID, black lighter and a set of keys.

## 2015-09-08 ENCOUNTER — Inpatient Hospital Stay (HOSPITAL_COMMUNITY)
Admission: AD | Admit: 2015-09-08 | Discharge: 2015-09-17 | DRG: 885 | Disposition: A | Discharge status: Home / Self Care | Payer: Medicare Other | Attending: Psychiatry | Admitting: Psychiatry

## 2015-09-08 ENCOUNTER — Encounter (HOSPITAL_COMMUNITY): Payer: Self-pay

## 2015-09-08 DIAGNOSIS — R45850 Homicidal ideations: Secondary | ICD-10-CM | POA: Diagnosis present

## 2015-09-08 DIAGNOSIS — R45851 Suicidal ideations: Secondary | ICD-10-CM | POA: Diagnosis present

## 2015-09-08 DIAGNOSIS — F17200 Nicotine dependence, unspecified, uncomplicated: Secondary | ICD-10-CM | POA: Diagnosis present

## 2015-09-08 DIAGNOSIS — F200 Paranoid schizophrenia: Secondary | ICD-10-CM | POA: Diagnosis present

## 2015-09-08 MED ORDER — HYDROXYZINE HCL 25 MG PO TABS: 25.0000 mg | ORAL_TABLET | Freq: Four times a day (QID) | ORAL | Status: DC | PRN

## 2015-09-08 MED ORDER — MAGNESIUM HYDROXIDE 400 MG/5ML PO SUSP: 30.0000 mL | Freq: Every day | ORAL | Status: DC | PRN

## 2015-09-08 MED ORDER — TRAZODONE HCL 100 MG PO TABS
100.0000 mg | ORAL_TABLET | Freq: Every day | ORAL | Status: DC
  Administered 2015-09-08 – 2015-09-16 (×9): 100 mg via ORAL
  Filled 2015-09-08 (×12): qty 1

## 2015-09-08 MED ORDER — BENZTROPINE MESYLATE 1 MG PO TABS
0.5000 mg | ORAL_TABLET | Freq: Two times a day (BID) | ORAL | Status: DC
  Administered 2015-09-08: 0.5 mg via ORAL
  Filled 2015-09-08: qty 1

## 2015-09-08 MED ORDER — ONDANSETRON HCL 4 MG PO TABS: 4.0000 mg | ORAL_TABLET | Freq: Three times a day (TID) | ORAL | Status: DC | PRN

## 2015-09-08 MED ORDER — HYDROXYZINE HCL 25 MG PO TABS
25.0000 mg | ORAL_TABLET | Freq: Four times a day (QID) | ORAL | Status: DC | PRN
  Administered 2015-09-15 – 2015-09-16 (×2): 25 mg via ORAL
  Filled 2015-09-08 (×2): qty 1

## 2015-09-08 MED ORDER — HALOPERIDOL 2 MG PO TABS
2.0000 mg | ORAL_TABLET | Freq: Two times a day (BID) | ORAL | Status: DC
  Administered 2015-09-08: 2 mg via ORAL
  Filled 2015-09-08: qty 1

## 2015-09-08 MED ORDER — BENZTROPINE MESYLATE 0.5 MG PO TABS
0.5000 mg | ORAL_TABLET | Freq: Two times a day (BID) | ORAL | Status: DC
  Administered 2015-09-08 – 2015-09-17 (×18): 0.5 mg via ORAL
  Filled 2015-09-08 (×23): qty 1

## 2015-09-08 MED ORDER — TRAZODONE HCL 100 MG PO TABS: 100.0000 mg | ORAL_TABLET | Freq: Every day | ORAL | Status: DC

## 2015-09-08 MED ORDER — ACETAMINOPHEN 325 MG PO TABS: 650.0000 mg | ORAL_TABLET | Freq: Four times a day (QID) | ORAL | Status: DC | PRN

## 2015-09-08 MED ORDER — HALOPERIDOL 2 MG PO TABS
2.0000 mg | ORAL_TABLET | Freq: Two times a day (BID) | ORAL | Status: DC
  Administered 2015-09-08 – 2015-09-16 (×16): 2 mg via ORAL
  Filled 2015-09-08 (×21): qty 1

## 2015-09-08 MED ORDER — ALUM & MAG HYDROXIDE-SIMETH 200-200-20 MG/5ML PO SUSP
30.0000 mL | ORAL | Status: DC | PRN
  Administered 2015-09-11 – 2015-09-17 (×2): 30 mL via ORAL
  Filled 2015-09-08 (×2): qty 30

## 2015-09-08 MED ORDER — ALUM & MAG HYDROXIDE-SIMETH 200-200-20 MG/5ML PO SUSP: 30.0000 mL | ORAL | Status: DC | PRN

## 2015-09-08 MED ORDER — IBUPROFEN 200 MG PO TABS: 600.0000 mg | ORAL_TABLET | Freq: Three times a day (TID) | ORAL | Status: DC | PRN
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
## 2015-09-08 NOTE — Progress Notes (Signed)
Patient ID: [name], male   DOB: 10/03/1960, 55 y.o.   MRN: [id number] PER STATE REGULATIONS 482.30  THIS CHART WAS REVIEWED FOR MEDICAL NECESSITY WITH RESPECT TO THE PATIENT'S ADMISSION/DURATION OF STAY.  NEXT REVIEW DATE:09/12/15  Loura HaltBARBARA [name], RN, BSN CASE MANAGER

## 2015-09-08 NOTE — BH Assessment (Signed)
Assessment completed. Consulted Malachy Chamberakia Starkes, NP who agrees that pt meets inpatient criteria. TTS to seek placement. Informed Dr. Ethelda ChickJacubowitz of the recommendation.

## 2015-09-08 NOTE — Tx Team (Addendum)
Initial Interdisciplinary Treatment Plan   PATIENT STRESSORS: Marital or family conflict Medication change or noncompliance   PATIENT STRENGTHS: Communication skills Physical Health   PROBLEM LIST: Problem List/Patient Goals Date to be addressed Date deferred Reason deferred Estimated date of resolution  Homicidal ideation 09/08/15     Psychosis 09/08/15     "Things to get better" 09/08/15     "Be able to go on some outings" 09/08/15                                    DISCHARGE CRITERIA:  Improved stabilization in mood, thinking, and/or behavior Verbal commitment to aftercare and medication compliance  PRELIMINARY DISCHARGE PLAN: Outpatient therapy medication management  PATIENT/FAMIILY INVOLVEMENT: This treatment plan has been presented to and reviewed with the patient, Zakhi Ching.  The patient and family have been given the opportunity to ask questions and make suggestions.  Norm ParcelHeather V Carless Slatten 09/08/2015, 4:52 PM
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
## 2015-09-08 NOTE — BH Assessment (Signed)
[facility] Assessment Progress Note  Per Thedore MinsMojeed Akintayo, MD, this pt requires psychiatric hospitalization at this time.  Berneice Heinrichina Tate, RN, [id number].  Pt presents under IVC, and [facility]  Pt's nurse has been notified, and agrees to call report to [phone].  Pt is to be transported via [employer] examinerlaw enforcement.  Doylene Canninghomas Charee Tumblin, MA Triage Specialist [phone]

## 2015-09-09 ENCOUNTER — Encounter (HOSPITAL_COMMUNITY): Payer: Self-pay | Admitting: Psychiatry

## 2015-09-09 DIAGNOSIS — F200 Paranoid schizophrenia: Principal | ICD-10-CM

## 2015-09-09 DIAGNOSIS — R45850 Homicidal ideations: Secondary | ICD-10-CM

## 2015-09-09 LAB — URINE CULTURE
Culture: <10000 — AB
SPECIAL REQUESTS: NORMAL

## 2015-09-09 MED ORDER — LAMOTRIGINE 25 MG PO TABS
25.0000 mg | ORAL_TABLET | Freq: Every evening | ORAL | Status: DC
  Administered 2015-09-09 – 2015-09-15 (×7): 25 mg via ORAL
  Filled 2015-09-09 (×9): qty 1

## 2015-09-09 MED ORDER — CITALOPRAM HYDROBROMIDE 10 MG PO TABS
10.0000 mg | ORAL_TABLET | Freq: Every day | ORAL | Status: DC
  Filled 2015-09-09 (×2): qty 1

## 2015-09-09 MED ORDER — OLANZAPINE 5 MG PO TBDP: 5.0000 mg | ORAL_TABLET | Freq: Three times a day (TID) | ORAL | Status: DC | PRN

## 2015-09-09 MED ORDER — OLANZAPINE 10 MG IM SOLR: 5.0000 mg | Freq: Three times a day (TID) | INTRAMUSCULAR | Status: DC | PRN
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
## 2015-09-09 NOTE — Progress Notes (Signed)
D: Pt denied depression, anxiety, pain, SI, HI or AVH; Pt was however, flat, isolated and withdrawn to room-was in bed for most of the evening. Pt stated, "I wasn't feeling too good earlier but I feel much better now; I am just a little sleepy. Pt remained calm and cooperative. A: Medications offered as prescribed.  Support, encouragement, and safe environment provided.  15-minute safety checks continue. R: Pt was med compliant. Pt attended wrap-up group. Safety checks continue.

## 2015-09-09 NOTE — Progress Notes (Signed)
Nursing note 09/09/2015 1900 - 09/10/15 0730  Data Patient appropriate and pleasant during interaction, stated "I had a good day, I went to groups, I've been up and down the hallway."  Affect bright during interaction, mood euthymic.  Denies SI, HI, and AVH.  Denied complaints.  Attended karaoke group tonight.  Action Spoke with patient 1:1, offered continued support throughout shift.  Remained on 15 minute checks.  Response Remains safe and appropriate on unit.

## 2015-09-09 NOTE — [facility] Suicide Risk Assessment (Signed)
[facility]   Nursing information obtained from:  Patient Demographic factors:  Male, Divorced or widowed Current Mental Status:  NA Loss Factors:  NA Historical Factors:  Impulsivity Risk Reduction Factors:  Living with another person, especially a relative  Total Time spent with patient: 30 minutes Principal Problem: Schizophrenia, paranoid type (HCC) Diagnosis:   Patient Active Problem List   Diagnosis Date Noted  . Paranoid schizophrenia (HCC) [F20.0] 09/08/2015  . Schizophrenia, paranoid type (HCC) [F20.0] 09/08/2015   Subjective Data: Please see H&P.   Continued Clinical Symptoms:  Alcohol Use Disorder Identification Test Final Score (AUDIT): 1 The "Alcohol Use Disorders Identification Test", Guidelines for Use in Primary Care, Second Edition.  World Science writerHealth Organization [facility](WHO). Score between 0-7:  no or low risk or alcohol related problems. Score between 8-15:  moderate risk of alcohol related problems. Score between 16-19:  high risk of alcohol related problems. Score 20 or above:  warrants further diagnostic evaluation for alcohol dependence and treatment.   CLINICAL FACTORS:   Previous Psychiatric Diagnoses and Treatments   Musculoskeletal: Strength & Muscle Tone: within normal limits Gait & Station: normal Patient leans: N/A  Psychiatric Specialty Exam: Physical Exam  Nursing note and vitals reviewed.   Review of Systems  Psychiatric/Behavioral: Positive for depression. The patient is nervous/anxious and has insomnia.   All other systems reviewed and are negative.   Blood pressure 110/71, pulse 90, temperature 98.3 F (36.8 C), temperature source Oral, resp. rate 20, height 5\' 7"  (1.702 m), weight 77.565 kg (171 lb), SpO2 100 %.Body mass index is 26.78 kg/(m^2).                        Please see H&P.                                   COGNITIVE FEATURES THAT CONTRIBUTE TO RISK:  Closed-mindedness,  Polarized thinking and Thought constriction (tunnel vision)    SUICIDE RISK:   Moderate:  Frequent suicidal ideation with limited intensity, and duration, some specificity in terms of plans, no associated intent, good self-control, limited dysphoria/symptomatology, some risk factors present, and identifiable protective factors, including available and accessible social support.  PLAN OF CARE: Please see H&P.   I certify that inpatient services furnished can reasonably be expected to improve the patient's condition.   Larkin Morelos, MD 09/09/2015, 2:21 PM
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
## 2015-09-09 NOTE — Progress Notes (Signed)
Patient has been visible on the milieu, engaged in groups and other unit activities. Patient denies SI, HI and AVH this shift.  Patient has had no incidents of behavioral dyscontrol.  Assess patient for safety, offer medications as prescribed, engage patient in 1:1 staff talks  Continue to monitor patients as prescribed.

## 2015-09-10 LAB — TSH: TSH: 2.502 u[IU]/mL (ref 0.350–4.500)

## 2015-09-10 LAB — BASIC METABOLIC PANEL
Anion gap: 6 (ref 5–15)
BUN: 13 mg/dL (ref 6–20)
CHLORIDE: 108 mmol/L (ref 101–111)
CO2: 25 mmol/L (ref 22–32)
Calcium: 9.2 mg/dL (ref 8.9–10.3)
Creatinine, Ser: 1.18 mg/dL (ref 0.61–1.24)
GFR calc non Af Amer: >60 mL/min (ref >60)
Glucose, Bld: 105 mg/dL — ABNORMAL HIGH (ref 65–99)
POTASSIUM: 3.9 mmol/L (ref 3.5–5.1)
SODIUM: 139 mmol/L (ref 135–145)

## 2015-09-10 LAB — LIPID PANEL
CHOL/HDL RATIO: 6.5 ratio
CHOLESTEROL: 195 mg/dL (ref 0–200)
HDL: 30 mg/dL — AB (ref >40)
LDL Cholesterol: 142 mg/dL — ABNORMAL HIGH (ref 0–99)
Triglycerides: 116 mg/dL (ref <150)
VLDL: 23 mg/dL (ref 0–40)

## 2015-09-10 MED ORDER — IBUPROFEN 400 MG PO TABS
400.0000 mg | ORAL_TABLET | Freq: Four times a day (QID) | ORAL | Status: DC | PRN
  Administered 2015-09-10 – 2015-09-12 (×3): 400 mg via ORAL
  Filled 2015-09-10 (×3): qty 1

## 2015-09-10 NOTE — Progress Notes (Signed)
Nursing Note 09/10/2015 1900 - 09/11/15 0730  Data Patient being monitored inpatient for s/p aggression and HI towards his nephew with delusions and psychosis.  Patient pleasant, calm, and appropriate this shift.  Affect animated, mood congruent "happy."  Denied current SI, HI, and AVH. Asleep shortly after HS meds.  0030 woke up c/o reflux.  Denied other physical complaints.  Action Spoke with patient 1:1, offered to be an ongoing support throughout shift.  Remained on 15 minute checks for safety.  Maalox given for reflux.  Response Remains safe and appropriate on unit. Patient appeared to be asleep shortly after receiving Maalox.

## 2015-09-10 NOTE — B[facility] Group Notes (Signed)
[facility] LCSW Group Therapy  09/10/2015  1:05 PM  Type of Therapy:  Group therapy  Participation Level:  Active  Participation Quality:  Attentive  Affect:  Flat  Cognitive:  Oriented  Insight:  Limited  Engagement in Therapy:  Limited  Modes of Intervention:  Discussion, Socialization  Summary of Progress/Problems:  Chaplain was here to lead a group on themes of hope and courage.  "Community is church.  That is where I find hope and faith.  But I haven't gone for 16 months.  I don't know why."  Talked abut "the church mother" who has been a very welcoming and supportive person to him.  "I like this place.  People care here."  Later talked abut stopping his meds, and wondering why he did that.    Ida Rogueorth, Katrinka Herbison B 09/10/2015 12:57 PM

## 2015-09-10 NOTE — Progress Notes (Signed)
D: Patient denies SI/HI and A/V hallucinations; patient reports sleep is good; reports appetite is good ; reports energy level is normal ; reports ability to concentrate is good; rates depression as 0/10; rates hopelessness 0/10; rates anxiety as 0/10  A: Monitored q 15 minutes; patient encouraged to attend groups; patient educated about medications; patient given medications per physician orders; patient encouraged to express feelings and/or concerns  R: Patient is pleasant and cooperative; patient is very minimal with staff and peers; patient forwards little information; patient was able to set goal to talk with staff 1:1 when having feelings of SI; patient is taking medications as prescribed and tolerating medications; patient is attending all groups

## 2015-09-10 NOTE — Progress Notes (Signed)
Adult Psychoeducational Group Note  Date:  09/10/2015 Time:  9:22 PM  Group Topic/Focus:  Wrap-Up Group:   The focus of this group is to help patients review their daily goal of treatment and discuss progress on daily workbooks.  Participation Level:  Minimal  Participation Quality:  Appropriate  Affect:  Appropriate  Cognitive:  Oriented  Insight: Limited  Engagement in Group:  Engaged  Modes of Intervention:  Socialization and Support  Additional Comments:  Patient attended and participated in group tonight. He reports having a perfect until the evening. He went outside earlier, and had a good time playing basketball. He attended group and went for his meals.   Lita MainsFrancis, Daylene Vandenbosch [facility] 09/10/2015, 9:22 PM

## 2015-09-10 NOTE — [facility] Suicide Risk Assessment (Signed)
[facility] INPATIENT:  Family/Significant Other Suicide Prevention Education  Suicide Prevention Education:  Education Completed; No one has been identified by the patient as the family member/significant other with whom the patient will be residing, and identified as the person(s) who will aid the patient in the event of a mental health crisis (suicidal ideations/suicide attempt).  With written consent from the patient, the family member/significant other has been provided the following suicide prevention education, prior to the and/or following the discharge of the patient.  The suicide prevention education provided includes the following:  Suicide risk factors  Suicide prevention and interventions  National Suicide Hotline telephone number  [facility] assessment telephone number  [facility] Emergency Assistance 911  [facility] and/or Residential Mobile Crisis Unit telephone number  Request made of family/significant other to:  Remove weapons (e.g., guns, rifles, knives), all items previously/currently identified as safety concern.    Remove drugs/medications (over-the-counter, prescriptions, illicit drugs), all items previously/currently identified as a safety concern.  The family member/significant other verbalizes understanding of the suicide prevention education information provided.  The family member/significant other agrees to remove the items of safety concern listed above. The patient did not endorse SI at the time of admission, nor did the patient c/o SI during the stay here.  SPE not required.   Daryel Geraldorth, Matti Killingsworth B 09/10/2015, 4:00 PM

## 2015-09-10 NOTE — Plan of Care (Signed)
Problem: Activity: Goal: Sleeping patterns will improve Outcome: Progressing Appears to be asleep at this time     

## 2015-09-10 NOTE — Progress Notes (Signed)
[facility] MD Progress Note  09/10/2015 11:56 AM Paul Mejia  MRN:  [id number] Subjective: Patient states " I still have homicidal thoughts.'  Objective:TRUE is a 55 yo AA male, who is on SSD , lives with his mother in [place] , who presented IVCed to [facility] for aggressive behavior at home.  Patient seen and chart reviewed.Discussed patient with treatment team.  Pt today is seen as irritable, but improving. Pt reports he is not too focussed on his paranoia , but continues to feel agitated at his nephew and continues to have HI towards him. Pt per staff - tolerating his medications well, denies ADRs. Will continue to encourage and support.    Principal Problem: Schizophrenia, paranoid type ([place]) Diagnosis:   Patient Active Problem List   Diagnosis Date Noted  . Paranoid schizophrenia ([place]) [F20.0] 09/08/2015  . Schizophrenia, paranoid type ([place]) [F20.0] 09/08/2015   Total Time spent with patient: 25 minutes  Past Psychiatric History: Please see H&P.   Past Medical History:  Past Medical History  Diagnosis Date  . Schizophrenia ([place])    History reviewed. No pertinent past surgical history. Family History:  Family History  Problem Relation Age of Onset  . Mental illness Neg Hx    Family Psychiatric  History: Please see H&P.  Social History:  History  Alcohol Use No     History  Drug Use No    Social History   Social History  . Marital Status: Single    Spouse Name: N/A  . Number of Children: N/A  . Years of Education: N/A   Social History Main Topics  . Smoking status: Current Every Day Smoker  . Smokeless tobacco: None  . Alcohol Use: No  . Drug Use: No  . Sexual Activity: Not Asked   Other Topics Concern  . None   Social History Narrative   Additional Social History:    History of alcohol / drug use?: No history of alcohol / drug abuse                    Sleep: Fair  Appetite:  Fair  Current Medications: Current Facility-Administered  Medications  Medication Dose Route Frequency Provider Last Rate Last Dose  . alum & mag hydroxide-simeth (MAALOX/MYLANTA) 200-200-20 MG/5ML suspension 30 mL  30 mL Oral Q4H PRN Patrecia Pour, NP      . benztropine (COGENTIN) tablet 0.5 mg  0.5 mg Oral BID Patrecia Pour, NP   0.5 mg at 09/10/15 0809  . haloperidol (HALDOL) tablet 2 mg  2 mg Oral BID Patrecia Pour, NP   2 mg at 09/10/15 0809  . hydrOXYzine (ATARAX/VISTARIL) tablet 25 mg  25 mg Oral Q6H PRN Patrecia Pour, NP      . ibuprofen (ADVIL,MOTRIN) tablet 400 mg  400 mg Oral Q6H PRN Ursula Alert, MD      . lamoTRIgine (LAMICTAL) tablet 25 mg  25 mg Oral QPM Ursula Alert, MD   25 mg at 09/09/15 1713  . magnesium hydroxide (MILK OF MAGNESIA) suspension 30 mL  30 mL Oral Daily PRN Patrecia Pour, NP      . OLANZapine zydis (ZYPREXA) disintegrating tablet 5 mg  5 mg Oral TID PRN Ursula Alert, MD       Or  . OLANZapine (ZYPREXA) injection 5 mg  5 mg Intramuscular TID PRN Ursula Alert, MD      . traZODone (DESYREL) tablet 100 mg  100 mg Oral QHS Patrecia Pour, NP  100 mg at 09/09/15 2144    Lab Results:  Results for orders placed or performed during the hospital encounter of 09/08/15 (from the past 48 hour(s))  TSH     Status: None   Collection Time: 09/10/15  6:30 AM  Result Value Ref Range   TSH 2.502 0.350 - 4.500 uIU/mL    Comment: Performed at [facility]  Lipid panel     Status: Abnormal   Collection Time: 09/10/15  6:30 AM  Result Value Ref Range   Cholesterol 195 0 - 200 mg/dL   Triglycerides 116 <150 mg/dL   HDL 30 (L) >40 mg/dL   Total CHOL/HDL Ratio 6.5 RATIO   VLDL 23 0 - 40 mg/dL   LDL Cholesterol 142 (H) 0 - 99 mg/dL    Comment:        Total Cholesterol/HDL:CHD Risk Coronary Heart Disease Risk Table                     Men   Women  1/2 Average Risk   3.4   3.3  Average Risk       5.0   4.4  2 X Average Risk   9.6   7.1  3 X Average Risk  23.4   11.0        Use the calculated  Patient Ratio above and the CHD Risk Table to determine the patient's CHD Risk.        ATP III CLASSIFICATION (LDL):  <100     mg/dL   Optimal  100-129  mg/dL   Near or Above                    Optimal  130-159  mg/dL   Borderline  160-189  mg/dL   High  >190     mg/dL   Very High Performed at [facility] metabolic panel     Status: Abnormal   Collection Time: 09/10/15  6:30 AM  Result Value Ref Range   Sodium 139 135 - 145 mmol/L   Potassium 3.9 3.5 - 5.1 mmol/L   Chloride 108 101 - 111 mmol/L   CO2 25 22 - 32 mmol/L   Glucose, Bld 105 (H) 65 - 99 mg/dL   BUN 13 6 - 20 mg/dL   Creatinine, Ser 1.18 0.61 - 1.24 mg/dL   Calcium 9.2 8.9 - 10.3 mg/dL   GFR calc non Af Amer >60 >60 mL/min   GFR calc Af Amer >60 >60 mL/min    Comment: (NOTE) The eGFR has been calculated using the CKD EPI equation. This calculation has not been validated in all clinical situations. eGFR's persistently <60 mL/min signify possible Chronic Kidney Disease.    Anion gap 6 5 - 15    Comment: Performed at [facility]    Blood Alcohol level:  Lab Results  Component Value Date   [facility] <5 09/07/2015   ETH <5 38/25/0539    Metabolic Disorder Labs: No results found for: HGBA1C, MPG No results found for: PROLACTIN Lab Results  Component Value Date   CHOL 195 09/10/2015   TRIG 116 09/10/2015   HDL 30* 09/10/2015   CHOLHDL 6.5 09/10/2015   VLDL 23 09/10/2015   LDLCALC 142* 09/10/2015    Physical Findings: AIMS: Facial and Oral Movements Muscles of Facial Expression: None, normal Lips and Perioral Area: None, normal Jaw: None, normal Tongue: None, normal,Extremity Movements Upper (arms, wrists, hands, fingers):  None, normal Lower (legs, knees, ankles, toes): None, normal, Trunk Movements Neck, shoulders, hips: None, normal, Overall Severity Severity of abnormal movements (highest score from questions above): None, normal Incapacitation due to abnormal  movements: None, normal Patient's awareness of abnormal movements (rate only patient's report): No Awareness, Dental Status Current problems with teeth and/or dentures?: No Does patient usually wear dentures?: No  CIWA:    COWS:     Musculoskeletal: Strength & Muscle Tone: within normal limits Gait & Station: normal Patient leans: N/A  Psychiatric Specialty Exam: Physical Exam  Nursing note and vitals reviewed.   Review of Systems  Psychiatric/Behavioral: The patient is nervous/anxious.   All other systems reviewed and are negative.   Blood pressure 111/78, pulse 80, temperature 98.1 F (36.7 C), temperature source Oral, resp. rate 20, height '5\' 7"'$  (1.702 m), weight 77.565 kg (171 lb), SpO2 100 %.Body mass index is 26.78 kg/(m^2).  General Appearance: Guarded  Eye Contact:  Fair  Speech:  Normal Rate  Volume:  Increased  Mood:  Angry and Anxious  Affect:  Congruent  Thought Process:  Goal Directed and Descriptions of Associations: Circumstantial  Orientation:  Full (Time, Place, and Person)  Thought Content:  Paranoid Ideation and Rumination  Suicidal Thoughts:  No  Homicidal Thoughts:  Yes.  without intent/plan  Memory:  Immediate;   Fair Recent;   Fair Remote;   Fair  Judgement:  Impaired  Insight:  Shallow  Psychomotor Activity:  Increased  Concentration:  Concentration: Poor and Attention Span: Poor  Recall:  [employer] of Knowledge:  Fair  Language:  Fair  Akathisia:  No  Handed:  Right  AIMS (if indicated):     Assets:  Desire for Improvement  ADL's:  Intact  Cognition:  WNL  Sleep:  Number of Hours: 6.5     Treatment Plan Summary:Jacody is a 54 yo AA male, who is on SSD , lives with his mother in [place] , who presented IVCed to [facility] for aggressive behavior at home.Patient continues to be irritable and has HI. Will continue treatment. Daily contact with patient to assess and evaluate symptoms and progress in treatment and Medication management Will continue  Haldol 2 mg po bid for psychosis. Will continue  Cogentin 0.5 mg po bid for EPS. Will continue Lamictal 25 mg po qpm for mood sx. Will make available PRN medications as per agitation protocol. Will continue Trazodone 100 mg po qhs for sleep. Will continue to monitor vitals ,medication compliance and treatment side effects while patient is here.  Will monitor for medical issues as well as call consult as needed.  Reviewed labs Na+ - 139 , tsh- wnl , lipid panel - abnormal - will recommend diet control. CSW will continue working on disposition.  Recreational therapy consult. Patient to participate in therapeutic milieu .  Acsa Estey, MD 09/10/2015, 11:56 AM

## 2015-09-11 LAB — HEMOGLOBIN A1C
Hgb A1c MFr Bld: 5.7 % — ABNORMAL HIGH (ref 4.8–5.6)
MEAN PLASMA GLUCOSE: 117 mg/dL

## 2015-09-11 LAB — PROLACTIN: Prolactin: 20.6 ng/mL — ABNORMAL HIGH (ref 4.0–15.2)

## 2015-09-11 NOTE — Progress Notes (Signed)
D: Patient denies SI/HI or AVH. Patient is pleasant and cooperative with staff and others on the unit. Pt. Reports that he is "having a good day".  Pt. Denies any physical complaints at this time.  A: Patient given emotional support from RN. Patient encouraged to come to staff with concerns and/or questions. Patient's medication routine continued. Patient's orders and plan of care reviewed.   R: Patient remains appropriate and cooperative. Will continue to monitor patient q15 minutes for safety.

## 2015-09-11 NOTE — Progress Notes (Signed)
[facility] Group Notes:  (Nursing/MHT/Case Management/Adjunct)  Date:  09/11/2015  Time:  11:56 PM  Type of Therapy:  Psychoeducational Skills  Participation Level:  Minimal  Participation Quality:  Attentive  Affect:  Blunted  Cognitive:  Appropriate  Insight:  Appropriate  Engagement in Group:  Limited  Modes of Intervention:  Education  Summary of Progress/Problems: The patient had little to share in group except that his day was "so so" and that he was doing what people were asking him to do. In terms of theme for the day, his coping skill will be to relax and to try to help himself more.   Hazle CocaGOODMAN, Akshay Spang S 09/11/2015, 11:56 PM

## 2015-09-11 NOTE — Progress Notes (Signed)
Patient ID: Paul Mejia, male   DOB: [birth year], 55 y.o.   MRN: [id number] [employer] MD Progress Note  09/11/2015 4:31 PM Clearence Vitug  MRN:  [id number]  Subjective: Patient states " I did not sleep well last night. My mood is up & down today. I feel like I get the chance, I will hurt myself. I'm hoping you all will help me to not feel like this all the time".  Objective:Paul Mejia is a 55 yo AA male, who is on SSD, lives with his mother in [place], who presented IVCed to [facility] for aggressive behavior at home.  Patient seen and chart reviewed.Discussed patient with treatment team. He says he is still having the mood instability & suicidal ideations. Able to contract for safety verbally on the unit. Pt reports he is not too focussed on his paranoia, but continues to feel agitated at his nephew and continues to have HI towards him. Pt per staff - tolerating his medications well, denies any adverse effects. Will continue to encourage and support.  Principal Problem: Schizophrenia, paranoid type ([place])  Diagnosis:   Patient Active Problem List   Diagnosis Date Noted  . Paranoid schizophrenia ([place]) [F20.0] 09/08/2015  . Schizophrenia, paranoid type ([place]) [F20.0] 09/08/2015   Total Time spent with patient: 15 minutes  Past Psychiatric History: Please see H&P.  Past Medical History:  Past Medical History  Diagnosis Date  . Schizophrenia ([place])    History reviewed. No pertinent past surgical history. Family History:  Family History  Problem Relation Age of Onset  . Mental illness Neg Hx    Family Psychiatric  History: Please see H&P.  Social History:  History  Alcohol Use No     History  Drug Use No    Social History   Social History  . Marital Status: Single    Spouse Name: N/A  . Number of Children: N/A  . Years of Education: N/A   Social History Main Topics  . Smoking status: Current Every Day Smoker  . Smokeless tobacco: None  . Alcohol Use: No  . Drug Use: No  . Sexual Activity:  Not Asked   Other Topics Concern  . None   Social History Narrative   Additional Social History:    History of alcohol / drug use?: No history of alcohol / drug abuse  Sleep: Fair  Appetite:  Fair  Current Medications: Current Facility-Administered Medications  Medication Dose Route Frequency Provider Last Rate Last Dose  . alum & mag hydroxide-simeth (MAALOX/MYLANTA) 200-200-20 MG/5ML suspension 30 mL  30 mL Oral Q4H PRN Patrecia Pour, NP   30 mL at 09/11/15 0037  . benztropine (COGENTIN) tablet 0.5 mg  0.5 mg Oral BID Patrecia Pour, NP   0.5 mg at 09/11/15 0805  . haloperidol (HALDOL) tablet 2 mg  2 mg Oral BID Patrecia Pour, NP   2 mg at 09/11/15 0805  . hydrOXYzine (ATARAX/VISTARIL) tablet 25 mg  25 mg Oral Q6H PRN Patrecia Pour, NP      . ibuprofen (ADVIL,MOTRIN) tablet 400 mg  400 mg Oral Q6H PRN Ursula Alert, MD   400 mg at 09/11/15 0511  . lamoTRIgine (LAMICTAL) tablet 25 mg  25 mg Oral QPM Saramma Eappen, MD   25 mg at 09/10/15 1711  . magnesium hydroxide (MILK OF MAGNESIA) suspension 30 mL  30 mL Oral Daily PRN Patrecia Pour, NP      . OLANZapine zydis (ZYPREXA) disintegrating tablet 5 mg  5 mg Oral  TID PRN Ursula Alert, MD       Or  . OLANZapine (ZYPREXA) injection 5 mg  5 mg Intramuscular TID PRN Ursula Alert, MD      . traZODone (DESYREL) tablet 100 mg  100 mg Oral QHS Patrecia Pour, NP   100 mg at 09/10/15 2155    Lab Results:  Results for orders placed or performed during the hospital encounter of 09/08/15 (from the past 48 hour(s))  TSH     Status: None   Collection Time: 09/10/15  6:30 AM  Result Value Ref Range   TSH 2.502 0.350 - 4.500 uIU/mL    Comment: Performed at [facility]  Lipid panel     Status: Abnormal   Collection Time: 09/10/15  6:30 AM  Result Value Ref Range   Cholesterol 195 0 - 200 mg/dL   Triglycerides 116 <150 mg/dL   HDL 30 (L) >40 mg/dL   Total CHOL/HDL Ratio 6.5 RATIO   VLDL 23 0 - 40 mg/dL   LDL  Cholesterol 142 (H) 0 - 99 mg/dL    Comment:        Total Cholesterol/HDL:CHD Risk Coronary Heart Disease Risk Table                     Men   Women  1/2 Average Risk   3.4   3.3  Average Risk       5.0   4.4  2 X Average Risk   9.6   7.1  3 X Average Risk  23.4   11.0        Use the calculated Patient Ratio above and the CHD Risk Table to determine the patient's CHD Risk.        ATP III CLASSIFICATION (LDL):  <100     mg/dL   Optimal  100-129  mg/dL   Near or Above                    Optimal  130-159  mg/dL   Borderline  160-189  mg/dL   High  >190     mg/dL   Very High Performed at [facility]   Hemoglobin A1c     Status: Abnormal   Collection Time: 09/10/15  6:30 AM  Result Value Ref Range   Hgb A1c MFr Bld 5.7 (H) 4.8 - 5.6 %    Comment: (NOTE)         Pre-diabetes: 5.7 - 6.4         Diabetes: >6.4         Glycemic control for adults with diabetes: <7.0    Mean Plasma Glucose 117 mg/dL    Comment: (NOTE) Performed At: [facility] [place], [place] [id number] Lindon Romp MD [id number] Performed at [facility]   Prolactin     Status: Abnormal   Collection Time: 09/10/15  6:30 AM  Result Value Ref Range   Prolactin 20.6 (H) 4.0 - 15.2 ng/mL    Comment: (NOTE) Performed At: [facility] [place], [place] [id number] Lindon Romp MD [id number] Performed at [place] metabolic panel     Status: Abnormal   Collection Time: 09/10/15  6:30 AM  Result Value Ref Range   Sodium 139 135 - 145 mmol/L   Potassium 3.9 3.5 - 5.1 mmol/L   Chloride 108 101 - 111 mmol/L   CO2 25 22 -  32 mmol/L   Glucose, Bld 105 (H) 65 - 99 mg/dL   BUN 13 6 - 20 mg/dL   Creatinine, Ser 1.18 0.61 - 1.24 mg/dL   Calcium 9.2 8.9 - 10.3 mg/dL   GFR calc non Af Amer >60 >60 mL/min   GFR calc Af Amer >60 >60 mL/min    Comment: (NOTE) The eGFR has been calculated using the CKD EPI  equation. This calculation has not been validated in all clinical situations. eGFR's persistently <60 mL/min signify possible Chronic Kidney Disease.    Anion gap 6 5 - 15    Comment: Performed at [facility]   Blood Alcohol level:  Lab Results  Component Value Date   [facility] <5 09/07/2015   ETH <5 62/70/3500   Metabolic Disorder Labs: Lab Results  Component Value Date   HGBA1C 5.7* 09/10/2015   MPG 117 09/10/2015   Lab Results  Component Value Date   PROLACTIN 20.6* 09/10/2015   Lab Results  Component Value Date   CHOL 195 09/10/2015   TRIG 116 09/10/2015   HDL 30* 09/10/2015   CHOLHDL 6.5 09/10/2015   VLDL 23 09/10/2015   LDLCALC 142* 09/10/2015   Physical Findings: AIMS: Facial and Oral Movements Muscles of Facial Expression: None, normal Lips and Perioral Area: None, normal Jaw: None, normal Tongue: None, normal,Extremity Movements Upper (arms, wrists, hands, fingers): None, normal Lower (legs, knees, ankles, toes): None, normal, Trunk Movements Neck, shoulders, hips: None, normal, Overall Severity Severity of abnormal movements (highest score from questions above): None, normal Incapacitation due to abnormal movements: None, normal Patient's awareness of abnormal movements (rate only patient's report): No Awareness, Dental Status Current problems with teeth and/or dentures?: No Does patient usually wear dentures?: No  CIWA:    COWS:     Musculoskeletal: Strength & Muscle Tone: within normal limits Gait & Station: normal Patient leans: N/A  Psychiatric Specialty Exam: Physical Exam  Nursing note and vitals reviewed.   Review of Systems  Psychiatric/Behavioral: The patient is nervous/anxious.   All other systems reviewed and are negative.   Blood pressure 144/91, pulse 65, temperature 98.2 F (36.8 C), temperature source Oral, resp. rate 16, height _0  (1.702 m), weight 77.565 kg (171 lb), SpO2 100 %.Body mass index is 26.78  kg/(m^2).  General Appearance: Guarded  Eye Contact:  Fair  Speech:  Normal Rate  Volume:  Increased  Mood:  Angry and Anxious  Affect:  Congruent  Thought Process:  Goal Directed and Descriptions of Associations: Circumstantial  Orientation:  Full (Time, Place, and Person)  Thought Content:  Paranoid Ideation and Rumination  Suicidal Thoughts:  No  Homicidal Thoughts:  Yes.  without intent/plan  Memory:  Immediate;   Fair Recent;   Fair Remote;   Fair  Judgement:  Impaired  Insight:  Shallow  Psychomotor Activity:  Increased  Concentration:  Concentration: Poor and Attention Span: Poor  Recall:  [employer] of Knowledge:  Fair  Language:  Fair  Akathisia:  No  Handed:  Right  AIMS (if indicated):     Assets:  Desire for Improvement  ADL's:  Intact  Cognition:  WNL  Sleep:  Number of Hours: 4.25    Treatment Plan Summary:Carlito is a 55 yo AA male, who is on SSD , lives with his mother in [place] , who presented IVCed to [facility] for aggressive behavior at home.Patient continues to be irritable and has HI. Will continue treatment. Daily contact with patient to assess and evaluate  symptoms and progress in treatment and Medication management Will continue Haldol 2 mg po bid for psychosis. Will continue the Cogentin 0.5 mg po bid for EPS. Will continue the Lamictal 25 mg po qpm for mood sx. Will make available PRN medications as per agitation protocol. Will continue Trazodone 100 mg po qhs for sleep. Will continue to monitor vitals ,medication compliance and treatment side effects while patient is here.  Will monitor for medical issues as well as call consult as needed.  Reviewed labs Na+ - 139 , tsh- wnl , lipid panel - abnormal - will recommend diet control. CSW will continue working on disposition.  Recreational therapy consult. Patient to participate in therapeutic milieu.   Encarnacion Slates, NP, PMHNP, FNP-BC 09/11/2015, 4:31 PM  I reviewed chart and agreed with the findings  and treatment Plan.  Berniece Andreas, MD

## 2015-09-11 NOTE — [facility] Group Notes (Signed)
[facility] Group Notes:  (Clinical Social Work)  09/11/2015  11:15-12:00PM  Summary of Progress/Problems:   Today's process group involved patients discussing things/behaviors in their lives that may have led to them being hospitalized at this time, as well as specific actions they could possibly take to prevent future hospitalizations. The list was large and varied, reflecting each group member's contributions.  Many questions were generated regarding diagnosis, drug use and impact on mental illness, and the similarities/differences between mental health issues and physical health issues. The patient expressed good comprehension of the topic matter, spoke up often and asked numerous questions, appeared to understand the answers.  He demonstrated a growth in insight.  Type of Therapy:  Group Therapy - Process  Participation Level:  Active  Participation Quality:  Attentive, Sharing and Supportive  Affect:  Blunted  Cognitive:  Appropriate  Insight:  Developing/Improving  Engagement in Therapy:  Engaged  Modes of Intervention:  Exploration, Discussion, Motivational Interviewing  Ambrose MantleMareida Grossman-Orr, LCSW 09/11/2015, 1:05 PM

## 2015-09-11 NOTE — Progress Notes (Signed)
Adult Psychoeducational Group Note  Date:  09/11/2015 Time:  12:28 PM  Group Topic/Focus:  [facility]: Health Coping Skills  Participation Level:  Active  Participation Quality:  Appropriate  Affect:  Appropriate  Cognitive:  Appropriate  Insight: Limited  Engagement in Group:  Engaged  Modes of Intervention:  Discussion and Education  Additional Comments:    [name], [name] M 09/11/2015, 12:28 PM

## 2015-09-12 NOTE — Progress Notes (Signed)
Paul Mejia rates Anxiety 5/10 and Depression 2/10. He states he did not achieve any of his goals today. Denies SI/HI/AVH. Encouragement and support given. Medications administered as prescribed. Will continue to monitor for patient safety and medication effectiveness. Q15 minute checks for patient safety.

## 2015-09-12 NOTE — [facility] Group Notes (Signed)
[facility] Group Notes:  (Nursing/MHT/Case Management/Adjunct)  Date:  09/12/2015  Time:  1030  Type of Therapy:  Nurse Education  Participation Level:  Active  Participation Quality:  Appropriate  Affect:  Appropriate  Cognitive:  Appropriate and Oriented  Insight:  Appropriate  Engagement in Group:  Engaged  Modes of Intervention:  Discussion, Education and Support  Summary of Progress/Problems: Paul Mejia was quiet but attentive during group and offered comments where appropriate. He shared that he was here in part because of his anger.  Maurine SimmeringShugart, Eustolia Drennen M 09/12/2015, 12:01 PM

## 2015-09-12 NOTE — Progress Notes (Signed)
Adult Psychoeducational Group Note  Date:  09/12/2015 Time:  8:20 PM  Group Topic/Focus:  Wrap-Up Group:   The focus of this group is to help patients review their daily goal of treatment and discuss progress on daily workbooks.  Participation Level:  Active  Participation Quality:  Appropriate  Affect:  Appropriate  Cognitive:  Appropriate  Insight: Appropriate  Engagement in Group:  Engaged  Modes of Intervention:  Discussion  Additional Comments:  Pt rated his overall day a 6 out of 10 and stated that his day was "good". Pt reported that he did not have a goal for the day.   Paul NeerJasmine S Mejia Paul Mejia 09/12/2015, 9:12 PM

## 2015-09-12 NOTE — Progress Notes (Signed)
Patient ID: Paul Mejia, Mejia   DOB: [birth year], 55 y.o.   MRN: [id number] Patient ID: Paul Mejia, Mejia   DOB: [birth year], 55 y.o.   MRN: [id number] [facility] MD Progress Note  09/12/2015 3:00 PM Paul Mejia  MRN:  [id number]  Subjective: Patient states "I'm doing better now than when I first woke up. My mood was real bad then"  Objective:Paul Mejia, who is on SSD, lives with his mother in [place], who presented IVCed to [facility] for aggressive behavior at home.  Patient seen and chart reviewed. Discussed patient with treatment team. He says he is still having the mood instability & suicidal ideations complaints. Able to contract for safety verbally on the unit. He is participating in the group sessions today.  Pt per staff - tolerating his medications well, denies any adverse effects. Will continue to encourage and support.  Principal Problem: Schizophrenia, paranoid type (HCC)  Diagnosis:   Patient Active Problem List   Diagnosis Date Noted  . Paranoid schizophrenia (HCC) [F20.0] 09/08/2015  . Schizophrenia, paranoid type (HCC) [F20.0] 09/08/2015   Total Time spent with patient: 15 minutes  Past Psychiatric History: Please see H&P.  Past Medical History:  Past Medical History  Diagnosis Date  . Schizophrenia (HCC)    History reviewed. No pertinent past surgical history. Family History:  Family History  Problem Relation Age of Onset  . Mental illness Neg Hx    Family Psychiatric  History: Please see H&P.  Social History:  History  Alcohol Use No     History  Drug Use No    Social History   Social History  . Marital Status: Single    Spouse Name: N/A  . Number of Children: N/A  . Years of Education: N/A   Social History Main Topics  . Smoking status: Current Every Day Smoker  . Smokeless tobacco: None  . Alcohol Use: No  . Drug Use: No  . Sexual Activity: Not Asked   Other Topics Concern  . None   Social History Narrative   Additional Social  History:    History of alcohol / drug use?: No history of alcohol / drug abuse  Sleep: Fair  Appetite:  Fair  Current Medications: Current Facility-Administered Medications  Medication Dose Route Frequency Provider Last Rate Last Dose  . alum & mag hydroxide-simeth (MAALOX/MYLANTA) 200-200-20 MG/5ML suspension 30 mL  30 mL Oral Q4H PRN Charm Rings, NP   30 mL at 09/11/15 0037  . benztropine (COGENTIN) tablet 0.5 mg  0.5 mg Oral BID Charm Rings, NP   0.5 mg at 09/12/15 0757  . haloperidol (HALDOL) tablet 2 mg  2 mg Oral BID Charm Rings, NP   2 mg at 09/12/15 0757  . hydrOXYzine (ATARAX/VISTARIL) tablet 25 mg  25 mg Oral Q6H PRN Charm Rings, NP      . ibuprofen (ADVIL,MOTRIN) tablet 400 mg  400 mg Oral Q6H PRN Jomarie Longs, MD   400 mg at 09/12/15 0424  . lamoTRIgine (LAMICTAL) tablet 25 mg  25 mg Oral QPM Saramma Eappen, MD   25 mg at 09/11/15 1840  . magnesium hydroxide (MILK OF MAGNESIA) suspension 30 mL  30 mL Oral Daily PRN Charm Rings, NP      . OLANZapine zydis (ZYPREXA) disintegrating tablet 5 mg  5 mg Oral TID PRN Jomarie Longs, MD       Or  . OLANZapine (ZYPREXA) injection 5 mg  5 mg Intramuscular TID  PRN Jomarie LongsSaramma Eappen, MD      . traZODone (DESYREL) tablet 100 mg  100 mg Oral QHS Charm RingsJamison Y Lord, NP   100 mg at 09/11/15 2137   Lab Results:  No results found for this or any previous visit (from the past 48 hour(s)). Blood Alcohol level:  Lab Results  Component Value Date   ETH <5 09/07/2015   ETH <5 05/05/2015   Metabolic Disorder Labs: Lab Results  Component Value Date   HGBA1C 5.7* 09/10/2015   MPG 117 09/10/2015   Lab Results  Component Value Date   PROLACTIN 20.6* 09/10/2015   Lab Results  Component Value Date   CHOL 195 09/10/2015   TRIG 116 09/10/2015   HDL 30* 09/10/2015   CHOLHDL 6.5 09/10/2015   VLDL 23 09/10/2015   LDLCALC 142* 09/10/2015   Physical Findings: AIMS: Facial and Oral Movements Muscles of Facial Expression: None,  normal Lips and Perioral Area: None, normal Jaw: None, normal Tongue: None, normal,Extremity Movements Upper (arms, wrists, hands, fingers): None, normal Lower (legs, knees, ankles, toes): None, normal, Trunk Movements Neck, shoulders, hips: None, normal, Overall Severity Severity of abnormal movements (highest score from questions above): None, normal Incapacitation due to abnormal movements: None, normal Patient's awareness of abnormal movements (rate only patient's report): No Awareness, Dental Status Current problems with teeth and/or dentures?: No Does patient usually wear dentures?: No  CIWA:    COWS:     Musculoskeletal: Strength & Muscle Tone: within normal limits Gait & Station: normal Patient leans: N/A  Psychiatric Specialty Exam: Physical Exam  Nursing note and vitals reviewed.   Review of Systems  Psychiatric/Behavioral: The patient is nervous/anxious.   All other systems reviewed and are negative.   Blood pressure 124/78, pulse 64, temperature 97.8 F (36.6 C), temperature source Oral, resp. rate 16, height 5\' 7"  (1.702 m), weight 77.565 kg (171 lb), SpO2 100 %.Body mass index is 26.78 kg/(m^2).  General Appearance: Guarded  Eye Contact:  Fair  Speech:  Normal Rate  Volume:  Increased  Mood:  Angry and Anxious  Affect:  Congruent  Thought Process:  Goal Directed and Descriptions of Associations: Circumstantial  Orientation:  Full (Time, Place, and Person)  Thought Content:  Paranoid Ideation and Rumination  Suicidal Thoughts:  No  Homicidal Thoughts:  Yes.  without intent/plan  Memory:  Immediate;   Fair Recent;   Fair Remote;   Fair  Judgement:  Impaired  Insight:  Shallow  Psychomotor Activity:  Increased  Concentration:  Concentration: Poor and Attention Span: Poor  Recall:  FiservFair  Fund of Knowledge:  Fair  Language:  Fair  Akathisia:  No  Handed:  Right  AIMS (if indicated):     Assets:  Desire for Improvement  ADL's:  Intact  Cognition:  WNL   Sleep:  Number of Hours: 5.5    Treatment Plan Summary:Enis is a 55 yo AA Mejia, who is on SSD , [place] , [facility].Patient continues to be irritable and has HI. Will continue treatment. Daily contact with patient to assess and evaluate symptoms and progress in treatment and Medication management Will continue Haldol 2 mg po bid for psychosis. Will continue the Cogentin 0.5 mg po bid for EPS. Will continue the Lamictal 25 mg po qpm for mood sx. Will make available PRN medications as per agitation protocol. Will continue Trazodone 100 mg po qhs for sleep. Will continue to monitor vitals ,  medication compliance and treatment side effects while patient is here.  Will monitor for medical issues as well as call consult as needed.  Reviewed labs Na+ - 139 , tsh- wnl , lipid panel - abnormal - will recommend diet control. CSW will continue working on disposition.  Recreational therapy consult. Patient to participate in therapeutic milieu.   Armandina Stammer I, NP, PMHNP, FNP-BC 09/12/2015, 3:00 PM  I reviewed chart and agreed with the findings and treatment Plan.  Kathryne Sharper, MD

## 2015-09-12 NOTE — Progress Notes (Signed)
Nursing Note 09/12/2015 1900 - 09/13/15 0730  Data Patient being monitored inpatient for s/p aggression and HI towards his nephew with delusions and psychosis. Patient pleasant, calm, and appropriate this shift. Affect animated, mood congruent "happy." "I'm feeling better every day." Denied current SI, HI, and AVH. Denied physical complaints.  Action Spoke with patient 1:1, offered to be an ongoing support throughout shift. Remained on 15 minute checks for safety.   Response Remains safe and appropriate on unit.

## 2015-09-12 NOTE — [facility] Group Notes (Signed)
[facility] Group Notes:  (Clinical Social Work)  09/12/2015  11:00AM-12:00PM  Summary of Progress/Problems:  The main focus of today's process group was to listen to a variety of genres of music and to identify that different types of music provoke different responses.  The patient then was able to identify personally what was soothing for them, as well as energizing, as well as how patient can personally use this knowledge in sleep habits, with depression, and with other symptoms.  The patient expressed at the beginning of group the overall feeling of "good" and he engaged well throughout group, talked freely about various emotions brought up by different songs.  At the end of group he said he "still feel good."  He smiled a lot.  Type of Therapy:  Music Therapy   Participation Level:  Active  Participation Quality:  Attentive and Sharing  Affect:  Appropriate  Cognitive:  Oriented  Insight:  Engaged  Engagement in Therapy:  Engaged  Modes of Intervention:   Activity, Exploration  Ambrose MantleMareida Grossman-Orr, LCSW 09/12/2015

## 2015-09-13 NOTE — Progress Notes (Signed)
DAR NOTE: Patient is calm and pleasant.  Denies pain, auditory and visual hallucinations.  Rates depression at 0, hopelessness at 0, and anxiety at 0.  Described energy level as normal and concentration as good.  Maintained on routine safety checks.  Medications given as prescribed.  Support and encouragement offered as needed.  Attended group and participated.    Patient observed socializing with peers in the dayroom.  Offered no complaint.

## 2015-09-13 NOTE — Progress Notes (Signed)
Patient ID: Paul Mejia, male   DOB: 13-Sep-1960, 55 y.o.   MRN: [id number] [facility] MD Progress Note  09/13/2015 4:43 PM Paul Mejia  MRN:  [id number]  Subjective: Patient reports he is feeling better. At this time states he came in to hospital " because I was upset with my nephew trying to get drugs with my money". States he  previously  had homicidal ideations towards this nephew but not any more - states " I don't want to hurt him, I just want him to leave me alone". Denies medication side effects.  Objective: I have discussed case with staff and reviewed notes, seen patient . Patient calm and polite on approach, no psychomotor agitation . Patient is reported as gradually improving compared to admission . As noted, denies homicidal ideations or violent ideations towards nephew or anyone else.  He denies any medication side effects at this time and he feels they are working well for him at present  No disruptive or agitated behaviors on the unit at this time, going to some groups .  Principal Problem: Schizophrenia, paranoid type (HCC)  Diagnosis:   Patient Active Problem List   Diagnosis Date Noted  . Paranoid schizophrenia (HCC) [F20.0] 09/08/2015  . Schizophrenia, paranoid type (HCC) [F20.0] 09/08/2015   Total Time spent with patient: 20 minutes   Past Psychiatric History: Please see H&P.  Past Medical History:  Past Medical History  Diagnosis Date  . Schizophrenia (HCC)    History reviewed. No pertinent past surgical history. Family History:  Family History  Problem Relation Age of Onset  . Mental illness Neg Hx    Family Psychiatric  History: Please see H&P.  Social History:  History  Alcohol Use No     History  Drug Use No    Social History   Social History  . Marital Status: Single    Spouse Name: N/A  . Number of Children: N/A  . Years of Education: N/A   Social History Main Topics  . Smoking status: Current Every Day Smoker  . Smokeless tobacco: None   . Alcohol Use: No  . Drug Use: No  . Sexual Activity: Not Asked   Other Topics Concern  . None   Social History Narrative   Additional Social History:    History of alcohol / drug use?: No history of alcohol / drug abuse  Sleep: improved   Appetite:  Improved   Current Medications: Current Facility-Administered Medications  Medication Dose Route Frequency Provider Last Rate Last Dose  . alum & mag hydroxide-simeth (MAALOX/MYLANTA) 200-200-20 MG/5ML suspension 30 mL  30 mL Oral Q4H PRN Charm Rings, NP   30 mL at 09/11/15 0037  . benztropine (COGENTIN) tablet 0.5 mg  0.5 mg Oral BID Charm Rings, NP   0.5 mg at 09/13/15 0865  . haloperidol (HALDOL) tablet 2 mg  2 mg Oral BID Charm Rings, NP   2 mg at 09/13/15 7846  . hydrOXYzine (ATARAX/VISTARIL) tablet 25 mg  25 mg Oral Q6H PRN Charm Rings, NP      . ibuprofen (ADVIL,MOTRIN) tablet 400 mg  400 mg Oral Q6H PRN Jomarie Longs, MD   400 mg at 09/12/15 0424  . lamoTRIgine (LAMICTAL) tablet 25 mg  25 mg Oral QPM Saramma Eappen, MD   25 mg at 09/12/15 1655  . magnesium hydroxide (MILK OF MAGNESIA) suspension 30 mL  30 mL Oral Daily PRN Charm Rings, NP      . OLANZapine zydis (  ZYPREXA) disintegrating tablet 5 mg  5 mg Oral TID PRN Jomarie LongsSaramma Eappen, MD       Or  . OLANZapine (ZYPREXA) injection 5 mg  5 mg Intramuscular TID PRN Jomarie LongsSaramma Eappen, MD      . traZODone (DESYREL) tablet 100 mg  100 mg Oral QHS Charm RingsJamison Y Lord, NP   100 mg at 09/12/15 2109   Lab Results:  No results found for this or any previous visit (from the past 48 hour(s)). Blood Alcohol level:  Lab Results  Component Value Date   ETH <5 09/07/2015   ETH <5 05/05/2015   Metabolic Disorder Labs: Lab Results  Component Value Date   HGBA1C 5.7* 09/10/2015   MPG 117 09/10/2015   Lab Results  Component Value Date   PROLACTIN 20.6* 09/10/2015   Lab Results  Component Value Date   CHOL 195 09/10/2015   TRIG 116 09/10/2015   HDL 30* 09/10/2015    CHOLHDL 6.5 09/10/2015   VLDL 23 09/10/2015   LDLCALC 142* 09/10/2015   Physical Findings: AIMS: Facial and Oral Movements Muscles of Facial Expression: None, normal Lips and Perioral Area: None, normal Jaw: None, normal Tongue: None, normal,Extremity Movements Upper (arms, wrists, hands, fingers): None, normal Lower (legs, knees, ankles, toes): None, normal, Trunk Movements Neck, shoulders, hips: None, normal, Overall Severity Severity of abnormal movements (highest score from questions above): None, normal Incapacitation due to abnormal movements: None, normal Patient's awareness of abnormal movements (rate only patient's report): No Awareness, Dental Status Current problems with teeth and/or dentures?: No Does patient usually wear dentures?: No  CIWA:    COWS:     Musculoskeletal: Strength & Muscle Tone: within normal limits Gait & Station: normal Patient leans: N/A  Psychiatric Specialty Exam: Physical Exam  Nursing note and vitals reviewed.   Review of Systems  Psychiatric/Behavioral: The patient is nervous/anxious.   All other systems reviewed and are negative. denies headache, denies chest pain, denies shortness of breath  Blood pressure 126/71, pulse 62, temperature 98.4 F (36.9 C), temperature source Oral, resp. rate 12, height 5\' 7"  (1.702 m), weight 171 lb (77.565 kg), SpO2 100 %.Body mass index is 26.78 kg/(m^2).  General Appearance: Fairly groomed   Eye Contact:  Good   Speech:  Normal Rate  Volume:  Normal  Mood:  Minimizes depression, presents vaguely anxious   Affect:  Blunted   Thought Process: presents goal directed, no clear thought disorder noted at this time   Orientation:  Full (Time, Place, and Person)  Thought Content:  At this time denies hallucinations, no delusions expressed, seems less focused on issues related to his nephew   Suicidal Thoughts:  No- denies any suicidal or self injurious ideations   Homicidal Thoughts:  Denies homicidal  ideations at present, specifically denies any plan or intention of harming or of HI towards nephew   Memory:  Recent and remote grossly intact   Judgement:  Fair  Insight:  Fair  Psychomotor Activity:  normal  Concentration:  Concentration: good  and Attention Span: Good  Recall:  Good  Fund of Knowledge:  Good  Language:  Good  Akathisia:  No- currently no akathisia  Handed:  Right  AIMS (if indicated):   no abnormal involuntary movements noted or reported at this time   Assets:  Desire for Improvement  ADL's:  Intact  Cognition:  WNL  Sleep:  Number of Hours: 3.75    Assessment - patient is a 55 year old man, who has been diagnosed  with schizophrenia, and who presented with paranoid ideations and aggressive behaviors . At this time patient improving compared to admission - behavior calm at this time, polite and cooperative on approach, and denies any current homicidal ideations . Tolerating Haldol, Lamictal well , denies medication side effects.  Treatment Plan Summary. Daily contact with patient to assess and evaluate symptoms and progress in treatment and Medication management  Encourage ongoing group and milieu participation to work on coping skills and symptom reduction Will continue Haldol 2 mg po bid for psychosis. Will continue  Cogentin 0.5 mg po bid for EPS. Will continue Lamictal 25 mg po qpm for mood disorder Will continue Trazodone 100 mg po qhs for sleep. Treatment team working on disposition planning   Nehemiah Massed, MD, 09/13/2015, 4:43 PM

## 2015-09-13 NOTE — [facility] Group Notes (Signed)
[facility]   09/13/2015 10:46 AM  Participation Quality:  Appropriate, active  Mood/Affect:  irritable  Plan for Discharge/Comments:  Unsure where he will live at discharge, not clear whether mother/sister will take him back.  States goal for today is to think through how to contact family and see whether he is welcome to return.  Wants to continue w [facility] for mental health care  Transportation Means: can be given bus pass   Supports:  Limited family support but patient articulates that he has contributed to issues in the family through his anger and irritability.  Sallee Langeunningham, Anne C

## 2015-09-13 NOTE — Progress Notes (Signed)
Patient ID: [name], male   DOB: 11/02/1960, 55 y.o.   MRN: [id number] PER STATE REGULATIONS 482.30  THIS CHART WAS REVIEWED FOR MEDICAL NECESSITY WITH RESPECT TO THE PATIENT'S ADMISSION/ DURATION OF STAY.  NEXT REVIEW DATE: 09/16/2015  Willa RoughJENNIFER JONES Ziv Welchel, RN, BSN CASE MANAGER

## 2015-09-13 NOTE — Progress Notes (Signed)
Paul Mejia rates Anxiety 5-6/10 and Depression 4/10. Denies SI/HI/AVH. Encouragement and support given. Medications administered as prescribed. Continue to monitor patient Q15 minutes for safety and medication effectiveness.

## 2015-09-13 NOTE — [facility] Group Notes (Signed)
[facility] LCSW Group Therapy  09/13/2015 5:19 PM  Type of Therapy:  Group Therapy  Participation Level:  Active  Participation Quality:  Appropriate and Attentive  Affect:  Appropriate  Cognitive:  Alert  Insight:  Developing/Improving  Engagement in Therapy:  Developing/Improving  Modes of Intervention:  Discussion, Exploration, Socialization and Support  Summary of Progress/Problems:  Today's Topic: Overcoming Obstacles. Patients identified one short term goal and potential obstacles in reaching this goal. Patients processed barriers involved in overcoming these obstacles. Patients identified steps necessary for overcoming these obstacles and explored motivation (internal and external) for facing these difficulties head on. Pt used "Positive Thoughts" cards to discuss ways to overcome life challenges, pair shared their thoughts w peers on unit and then participated in large group discussion of ways to overcome challenges.  Santa GeneraAnne Jalene Lacko, LCSW Lead Clinical Social Worker Phone:  [phone]

## 2015-09-14 NOTE — Progress Notes (Signed)
Denies SI/HI/AVH. Encouragement and support given. Medications administered as prescribed. Continue to monitor patient Q15 minutes for safety and medication effectiveness.  

## 2015-09-14 NOTE — [facility] Group Notes (Signed)
Adult Psychoeducational Group Note  Date:  09/14/2015 Time:  9:06 PM  Group Topic/Focus:  Wrap-Up Group:   The focus of this group is to help patients review their daily goal of treatment and discuss progress on daily workbooks.  Participation Level:  Minimal  Participation Quality:  Attentive  Affect:  Flat  Cognitive:  Appropriate  Insight: Good  Engagement in Group:  Limited  Modes of Intervention:  Discussion  Additional Comments:  Pt rated his day an 8.  Pt stated his day was pretty.  Pt had no goal and no discharge plans.  Caroll RancherLindsay, Tucker Steedley A 09/14/2015, 9:06 PM

## 2015-09-14 NOTE — Progress Notes (Signed)
Patient ID: Paul Mejia, male   DOB: [birth year], 55 y.o.   MRN: [id number] [facility] MD Progress Note  09/14/2015 1:42 PM Paul Mejia  MRN:  [id number]  Subjective: Patient reports he is feeling better. At this time states he came in to hospital " because I was upset with my nephew trying to get drugs with my money". States he  previously  had homicidal ideations towards this nephew but not any more - states " I don't want to hurt him, I just want him to leave me alone. I work hard and don't want to end up in prison."   Objective: Patient is seen and chart is reviewed.  Patient calm and polite on approach, no psychomotor agitation. Patient is reported as gradually improving compared to admission .As noted, denies homicidal ideations or violent ideations towards nephew or anyone else. Patient reports that in the past he "saw spirits and felt paranoid." but feels the haldol is helping to control those symptoms. He reports multiple past hospitalizations for schizophrenia.  He denies any medication side effects at this time and he feels they are working well for him at present  No disruptive or agitated behaviors on the unit at this time, going to some groups .  Principal Problem: Schizophrenia, paranoid type (HCC)  Diagnosis:   Patient Active Problem List   Diagnosis Date Noted  . Paranoid schizophrenia (HCC) [F20.0] 09/08/2015  . Schizophrenia, paranoid type (HCC) [F20.0] 09/08/2015   Total Time spent with patient: 20 minutes   Past Psychiatric History: Please see H&P.  Past Medical History:  Past Medical History  Diagnosis Date  . Schizophrenia (HCC)    History reviewed. No pertinent past surgical history. Family History:  Family History  Problem Relation Age of Onset  . Mental illness Neg Hx    Family Psychiatric  History: Please see H&P.  Social History:  History  Alcohol Use No     History  Drug Use No    Social History   Social History  . Marital Status: Single     Spouse Name: N/A  . Number of Children: N/A  . Years of Education: N/A   Social History Main Topics  . Smoking status: Current Every Day Smoker  . Smokeless tobacco: None  . Alcohol Use: No  . Drug Use: No  . Sexual Activity: Not Asked   Other Topics Concern  . None   Social History Narrative   Additional Social History:    History of alcohol / drug use?: No history of alcohol / drug abuse  Sleep: improved   Appetite:  Improved   Current Medications: Current Facility-Administered Medications  Medication Dose Route Frequency Provider Last Rate Last Dose  . alum & mag hydroxide-simeth (MAALOX/MYLANTA) 200-200-20 MG/5ML suspension 30 mL  30 mL Oral Q4H PRN Charm Rings, NP   30 mL at 09/11/15 0037  . benztropine (COGENTIN) tablet 0.5 mg  0.5 mg Oral BID Charm Rings, NP   0.5 mg at 09/14/15 0757  . haloperidol (HALDOL) tablet 2 mg  2 mg Oral BID Charm Rings, NP   2 mg at 09/14/15 0757  . hydrOXYzine (ATARAX/VISTARIL) tablet 25 mg  25 mg Oral Q6H PRN Charm Rings, NP      . ibuprofen (ADVIL,MOTRIN) tablet 400 mg  400 mg Oral Q6H PRN Jomarie Longs, MD   400 mg at 09/12/15 0424  . lamoTRIgine (LAMICTAL) tablet 25 mg  25 mg Oral QPM Jomarie Longs, MD   25 mg  at 09/13/15 1824  . magnesium hydroxide (MILK OF MAGNESIA) suspension 30 mL  30 mL Oral Daily PRN Charm Rings, NP      . OLANZapine zydis (ZYPREXA) disintegrating tablet 5 mg  5 mg Oral TID PRN Jomarie Longs, MD       Or  . OLANZapine (ZYPREXA) injection 5 mg  5 mg Intramuscular TID PRN Jomarie Longs, MD      . traZODone (DESYREL) tablet 100 mg  100 mg Oral QHS Charm Rings, NP   100 mg at 09/13/15 2144   Lab Results:  No results found for this or any previous visit (from the past 48 hour(s)). Blood Alcohol level:  Lab Results  Component Value Date   ETH <5 09/07/2015   ETH <5 05/05/2015   Metabolic Disorder Labs: Lab Results  Component Value Date   HGBA1C 5.7* 09/10/2015   MPG 117 09/10/2015   Lab  Results  Component Value Date   PROLACTIN 20.6* 09/10/2015   Lab Results  Component Value Date   CHOL 195 09/10/2015   TRIG 116 09/10/2015   HDL 30* 09/10/2015   CHOLHDL 6.5 09/10/2015   VLDL 23 09/10/2015   LDLCALC 142* 09/10/2015   Physical Findings: AIMS: Facial and Oral Movements Muscles of Facial Expression: None, normal Lips and Perioral Area: None, normal Jaw: None, normal Tongue: None, normal,Extremity Movements Upper (arms, wrists, hands, fingers): None, normal Lower (legs, knees, ankles, toes): None, normal, Trunk Movements Neck, shoulders, hips: None, normal, Overall Severity Severity of abnormal movements (highest score from questions above): None, normal Incapacitation due to abnormal movements: None, normal Patient's awareness of abnormal movements (rate only patient's report): No Awareness, Dental Status Current problems with teeth and/or dentures?: No Does patient usually wear dentures?: No  CIWA:    COWS:     Musculoskeletal: Strength & Muscle Tone: within normal limits Gait & Station: normal Patient leans: N/A  Psychiatric Specialty Exam: Physical Exam  Nursing note and vitals reviewed.   Review of Systems  Constitutional: Negative.   HENT: Negative.   Eyes: Negative.   Respiratory: Negative.   Cardiovascular: Negative.   Gastrointestinal: Negative.   Genitourinary: Negative.   Musculoskeletal: Negative.   Skin: Negative.   Neurological: Negative.   Endo/Heme/Allergies: Negative.   Psychiatric/Behavioral: The patient is nervous/anxious.   All other systems reviewed and are negative. denies headache, denies chest pain, denies shortness of breath  Blood pressure 106/80, pulse 97, temperature 98.4 F (36.9 C), temperature source Oral, resp. rate 20, height 5\' 7"  (1.702 m), weight 77.565 kg (171 lb), SpO2 100 %.Body mass index is 26.78 kg/(m^2).  General Appearance: Fairly groomed   Eye Contact:  Good   Speech:  Normal Rate  Volume:  Normal   Mood:  Minimizes depression, presents vaguely anxious   Affect:  Blunted   Thought Process: presents goal directed, no clear thought disorder noted at this time   Orientation:  Full (Time, Place, and Person)  Thought Content:  At this time denies hallucinations, no delusions expressed, seems less focused on issues related to his nephew   Suicidal Thoughts:  No- denies any suicidal or self injurious ideations   Homicidal Thoughts:  Denies homicidal ideations at present, specifically denies any plan or intention of harming or of HI towards nephew   Memory:  Recent and remote grossly intact   Judgement:  Fair  Insight:  Fair  Psychomotor Activity:  normal  Concentration:  Concentration: good  and Attention Span: Good  Recall:  Good  Fund of Knowledge:  Good  Language:  Good  Akathisia:  No- currently no akathisia  Handed:  Right  AIMS (if indicated):   no abnormal involuntary movements noted or reported at this time   Assets:  Desire for Improvement  ADL's:  Intact  Cognition:  WNL  Sleep:  Number of Hours: 6.5    Assessment - Patient is a 55 year old man, who has been diagnosed with schizophrenia, and who presented with paranoid ideations and aggressive behaviors . At this time patient improving compared to admission - behavior calm at this time, polite and cooperative on approach, and denies any current homicidal ideations . Tolerating Haldol, Lamictal well , denies medication side effects.   Treatment Plan Summary. Daily contact with patient to assess and evaluate symptoms and progress in treatment and Medication management  Encourage ongoing group and milieu participation to work on coping skills and symptom reduction Will continue Haldol 2 mg po bid for psychosis. Will continue  Cogentin 0.5 mg po bid for EPS. Will continue Lamictal 25 mg po qpm for mood disorder Will continue Trazodone 100 mg po qhs for sleep. Treatment team working on disposition planning   DAVIS, Vernona RiegerLAURA,  NP, 09/14/2015, 1:42 PM  Agree with NP Progress Note, as above

## 2015-09-14 NOTE — [facility] Group Notes (Signed)
[facility] Group Notes:  (Nursing/MHT/Case Management/Adjunct)  Date:  09/14/2015  Time:  12:42 PM  Type of Therapy:  Nurse Education  Participation Level:  Active  Participation Quality:  Appropriate and Attentive  Affect:  Appropriate  Cognitive:  Alert and Appropriate  Insight:  Appropriate and Good  Engagement in Group:  Engaged and Improving  Modes of Intervention:  Discussion and Education  Summary of Progress/Problems: Topic was on recovery.  Discussed recovery as a process.  Group encouraged to stay focus and to complete the recovery process.  Group discussed what recovery means to them and what changes they are here to maintained.  Patient was attentive and receptive.      Paul Mejia 09/14/2015, 12:42 PM

## 2015-09-14 NOTE — Tx Team (Signed)
Interdisciplinary Treatment Plan Update (Adult)  Date:  09/14/2015   Time Reviewed:  2:08 PM   Progress in Treatment: Attending groups: Yes. Participating in groups:  Yes. Taking medication as prescribed:  Yes. Tolerating medication:  Yes. Family/Significant other contact made: No  Patient understands diagnosis:  Yes AEB his improving ability to discuss problems with staff Discussing patient identified problems/goals with staff:  Yes, see initial care plan. Medical problems stabilized or resolved:  Yes. Denies suicidal/homicidal ideation: Yes. Issues/concerns per patient self-inventory:  No. Other:  New problem(s) identified: None identified at this time.   Discharge Plan or Barriers: Pt will return home and follow-up with outpatient services.   Reason for Continuation of Hospitalization: Medication stabilization Paranoia, delusions Depression, HI  Comments:  Paul Mejia is an 54 y.o. male presenting to [facility] under IVC by his sister. Pt reported that he was fighting with his nephew today because his nephew is a pot head. Pt stated "he just want to go to the drug lords and try to get me cut and shot". "I told him to leave but he wouldn't". "I felt like he had something done to me". Pt denies SI. Pt did not report any previous suicide attempts or self-injurious behaviors. Pt is reported auditory hallucinations and shared that he has trouble concentrating due to the voices. PT is also endorsing homicidal ideation towards his nephew. Will continue Haldol 2 mg po bid for psychosis. Will add Cogentin 0.5 mg po bid for EPS. Will start Lamictal 25 mg po qpm for mood sx. Will make available PRN medications as per agitation protocol. Will continue Trazodone 100 mg po qhs for sleep. Estimated length of stay: 4-5 days  New goal(s):  Review of initial/current patient goals per problem list:   Review of initial/current patient goals per problem list:  1. Goal(s): Patient will participate  in aftercare plan   Met: Yes   Target date: 3-5 days post admission date   As evidenced by: Patient will participate within aftercare plan AEB aftercare provider and housing plan at discharge being identified. 09/09/15:  Return home, follow up outpt   2. Goal (s): Patient will exhibit decreased depressive symptoms and suicidal ideations.   Met: Progressing   Target date: 3-5 days post admission date   As evidenced by: Patient will utilize self rating of depression at 3 or below and demonstrate decreased signs of depression or be deemed stable for discharge by MD. 09/09/15:  "I'm depressed a lot.  My head is down instead of up." 09/14/15: Pt rates depression at 5/10; denies SI   5. Goal(s): Patient will demonstrate decreased signs of psychosis  * Met: Progressing  * Target date: 3-5 days post admission date  * As evidenced by: Patient will demonstrate decreased frequency of AVH or return to baseline function 09/09/15:  Presents as delusional, paranoid 7/11/7: Though process is improving; Pt able to identify that he has hx of delusions and paranoia.   Attendees: Patient:  09/14/2015 2:08 PM   Family:   09/14/2015 2:08 PM   Physician:  Dr. Cobos, MD 09/14/2015 2:08 PM   Nursing:   Olivette W, RN; Elizabeth I., RN 09/14/2015 2:08 PM   CSW:     Carter, LCSWA  09/14/2015 2:08 PM   Other:  09/14/2015 2:08 PM   Other:   09/14/2015 2:08 PM   Other:  Jennifer Clark, Nurse CM 09/14/2015 2:08 PM   Other:   09/14/2015 2:08 PM   Other:  09/14/2015 2:08 PM     Other:  09/14/2015 2:08 PM   Other:  09/14/2015 2:08 PM   Other:  09/14/2015 2:08 PM   Other:  09/14/2015 2:08 PM   Other:  09/14/2015 2:08 PM   Other:   09/14/2015 2:08 PM    Scribe for Treatment Team:   Peri Maris, [place] [employer] [phone]

## 2015-09-14 NOTE — Progress Notes (Signed)
DAR NOTE: Patient presents with anxious affect and depressed mood.  Denies pain, auditory and visual hallucinations.  Rates depression at 0, hopelessness at 0, and anxiety at 0.  Maintained on routine safety checks.  Medications given as prescribed.  Support and encouragement offered as needed.  Attended group and participate Patient observed socializing with peers in the dayroom.  Offered no complaint.

## 2015-09-15 NOTE — Progress Notes (Signed)
Adult Psychoeducational Group Note  Date:  09/15/2015 Time:  9:17 PM  Group Topic/Focus:  Wrap-Up Group:   The focus of this group is to help patients review their daily goal of treatment and discuss progress on daily workbooks.  Participation Level:  Active  Participation Quality:  Appropriate  Affect:  Appropriate  Cognitive:  Oriented  Insight: Limited  Engagement in Group:  Engaged  Modes of Intervention:  Socialization and Support  Additional Comments:  Patient attended and participated in group tonight. He reports having a great day. He took his medication, showered, went for his meals and attended groups. He went to the G[facility], spoke with his  Child psychotherapistocial Worker and his Doctor.   Lita MainsFrancis, Verity [facility] 09/15/2015, 9:17 PM

## 2015-09-15 NOTE — Progress Notes (Signed)
Patient ID: Paul Mejia, male   DOB: 09/27/1960, 55 y.o.   MRN: [id number]  DAR: Pt. Denies SI/HI and A/V Hallucinations. He reports sleep is good, appetite is good, energy level is normal, and concentration is good. He rates depression, anxiety, and hopelessness 0/10. Patient does report back pain 2/10 but refuses intervention at this time. He is aware of interventions if the pain level increases. Support and encouragement provided to the patient. Scheduled medications administered to patient per physician's orders. Patient is minimal with Clinical research associatewriter but cooperative. He is seen in the milieu interacting with staff and is attending groups. Q15 minute checks are maintained for safety.

## 2015-09-15 NOTE — [facility] Group Notes (Signed)
[facility] 09/15/2015 1:15pm  Type of Therapy: Mental Health Association Presentation  Participation Level: Active  Participation Quality: Attentive  Affect: Appropriate  Cognitive: Oriented  Insight: Developing/Improving  Engagement in Therapy: Engaged  Modes of Intervention: Discussion, Education and Socialization  Summary of Progress/Problems: Mental Health Association (MHA) Speaker came to talk about his personal journey with substance abuse and addiction. The pt processed ways by which to relate to the speaker. MHA speaker provided handouts and educational information pertaining to groups and services offered by the [facility]. Pt was engaged in speaker's presentation and was receptive to resources provided.    Chad CordialLauren Carter, LCSWA 09/15/2015 2:03 PM

## 2015-09-15 NOTE — Progress Notes (Signed)
Patient ID: Paul Mejia, male   DOB: February 02, 1961, 55 y.o.   MRN: [id number] Patient ID: Paul Mejia, male   DOB: 18-Jan-1961, 55 y.o.   MRN: [id number] [facility] MD Progress Note  09/15/2015 2:16 PM Paul Mejia  MRN:  [id number]  Subjective:  Paul Mejia reports, "My whole outlook on things is changing for good. Mejia'm planning from here on to work on my own living status. Mejia'm learning more from groups & Mejia try to attend every group sessions being held. My my is getting better. Mejia'm not as upset as Mejia was prior to coming in to the hospital".  Objective: Patient is seen and chart is reviewed. Patient calm and polite on approach, no psychomotor agitation. Patient is reported as gradually improving compared to admission . As noted, denies homicidal ideations or violent ideations towards nephew or anyone else. Patient reports that in the past he "saw spirits and felt paranoid." but feels the haldol is helping to control those symptoms. He reports multiple past hospitalizations for schizophrenia.  He denies any medication side effects at this time and he feels they are working well for him at present No disruptive or agitated behaviors on the unit at this time, going to some groups .  Principal Problem: Schizophrenia, paranoid type (HCC)  Diagnosis:   Patient Active Problem List   Diagnosis Date Noted  . Paranoid schizophrenia (HCC) [F20.0] 09/08/2015  . Schizophrenia, paranoid type (HCC) [F20.0] 09/08/2015   Total Time spent with patient: 20 minutes   Past Psychiatric History: Schizophrenia  Past Medical History:  Past Medical History  Diagnosis Date  . Schizophrenia (HCC)    History reviewed. No pertinent past surgical history. Family History:  Family History  Problem Relation Age of Onset  . Mental illness Neg Hx    Family Psychiatric  History: Please see H&P.  Social History:  History  Alcohol Use No     History  Drug Use No    Social History   Social History  . Marital Status: Single     Spouse Name: N/A  . Number of Children: N/A  . Years of Education: N/A   Social History Main Topics  . Smoking status: Current Every Day Smoker  . Smokeless tobacco: None  . Alcohol Use: No  . Drug Use: No  . Sexual Activity: Not Asked   Other Topics Concern  . None   Social History Narrative   Additional Social History:    History of alcohol / drug use?: No history of alcohol / drug abuse  Sleep: improved   Appetite:  Improved   Current Medications: Current Facility-Administered Medications  Medication Dose Route Frequency Provider Last Rate Last Dose  . alum & mag hydroxide-simeth (MAALOX/MYLANTA) 200-200-20 MG/5ML suspension 30 mL  30 mL Oral Q4H PRN Charm Rings, NP   30 mL at 09/11/15 0037  . benztropine (COGENTIN) tablet 0.5 mg  0.5 mg Oral BID Charm Rings, NP   0.5 mg at 09/15/15 6295  . haloperidol (HALDOL) tablet 2 mg  2 mg Oral BID Charm Rings, NP   2 mg at 09/15/15 2841  . hydrOXYzine (ATARAX/VISTARIL) tablet 25 mg  25 mg Oral Q6H PRN Charm Rings, NP      . ibuprofen (ADVIL,MOTRIN) tablet 400 mg  400 mg Oral Q6H PRN Jomarie Longs, MD   400 mg at 09/12/15 0424  . lamoTRIgine (LAMICTAL) tablet 25 mg  25 mg Oral QPM Saramma Eappen, MD   25 mg at 09/14/15 1704  .  magnesium hydroxide (MILK OF MAGNESIA) suspension 30 mL  30 mL Oral Daily PRN Charm Rings, NP      . OLANZapine zydis (ZYPREXA) disintegrating tablet 5 mg  5 mg Oral TID PRN Jomarie Longs, MD       Or  . OLANZapine (ZYPREXA) injection 5 mg  5 mg Intramuscular TID PRN Jomarie Longs, MD      . traZODone (DESYREL) tablet 100 mg  100 mg Oral QHS Charm Rings, NP   100 mg at 09/14/15 2144   Lab Results:  No results found for this or any previous visit (from the past 48 hour(s)). Blood Alcohol level:  Lab Results  Component Value Date   ETH <5 09/07/2015   ETH <5 05/05/2015   Metabolic Disorder Labs: Lab Results  Component Value Date   HGBA1C 5.7* 09/10/2015   MPG 117 09/10/2015   Lab  Results  Component Value Date   PROLACTIN 20.6* 09/10/2015   Lab Results  Component Value Date   CHOL 195 09/10/2015   TRIG 116 09/10/2015   HDL 30* 09/10/2015   CHOLHDL 6.5 09/10/2015   VLDL 23 09/10/2015   LDLCALC 142* 09/10/2015   Physical Findings: AIMS: Facial and Oral Movements Muscles of Facial Expression: None, normal Lips and Perioral Area: None, normal Jaw: None, normal Tongue: None, normal,Extremity Movements Upper (arms, wrists, hands, fingers): None, normal Lower (legs, knees, ankles, toes): None, normal, Trunk Movements Neck, shoulders, hips: None, normal, Overall Severity Severity of abnormal movements (highest score from questions above): None, normal Incapacitation due to abnormal movements: None, normal Patient's awareness of abnormal movements (rate only patient's report): No Awareness, Dental Status Current problems with teeth and/or dentures?: No Does patient usually wear dentures?: No  CIWA:    COWS:     Musculoskeletal: Strength & Muscle Tone: within normal limits Gait & Station: normal Patient leans: N/A  Psychiatric Specialty Exam: Physical Exam  Nursing note and vitals reviewed.   Review of Systems  Constitutional: Negative.   HENT: Negative.   Eyes: Negative.   Respiratory: Negative.   Cardiovascular: Negative.   Gastrointestinal: Negative.   Genitourinary: Negative.   Musculoskeletal: Negative.   Skin: Negative.   Neurological: Negative.   Endo/Heme/Allergies: Negative.   Psychiatric/Behavioral: The patient is nervous/anxious.   All other systems reviewed and are negative. denies headache, denies chest pain, denies shortness of breath  Blood pressure 115/83, pulse 88, temperature 98.1 F (36.7 C), temperature source Oral, resp. rate 18, height 5\' 7"  (1.702 m), weight 77.565 kg (171 lb), SpO2 100 %.Body mass index is 26.78 kg/(m^2).  General Appearance: Fairly groomed   Eye Contact:  Good   Speech:  Normal Rate  Volume:  Normal   Mood:  Minimizes depression, presents vaguely anxious   Affect:  Blunted   Thought Process: presents goal directed, no clear thought disorder noted at this time   Orientation:  Full (Time, Place, and Person)  Thought Content:  At this time denies hallucinations, no delusions expressed, seems less focused on issues related to his nephew   Suicidal Thoughts:  No- denies any suicidal or self injurious ideations   Homicidal Thoughts:  Denies homicidal ideations at present, specifically denies any plan or intention of harming or of HI towards nephew   Memory:  Recent and remote grossly intact   Judgement:  Fair  Insight:  Fair  Psychomotor Activity:  normal  Concentration:  Concentration: good  and Attention Span: Good  Recall:  Good  Fund of Knowledge:  Good  Language:  Good  Akathisia:  No- currently no akathisia  Handed:  Right  AIMS (if indicated):   no abnormal involuntary movements noted or reported at this time   Assets:  Desire for Improvement  ADL's:  Intact  Cognition:  WNL  Sleep:  Number of Hours: 5   Assessment - Patient is a 55 year old man, who has been diagnosed with schizophrenia, and who presented with paranoid ideations and aggressive behaviors . At this time patient improving compared to admission - behavior calm at this time, polite and cooperative on approach, and denies any current homicidal ideations . Tolerating Haldol, Lamictal well , denies medication side effects.   Treatment Plan Summary. Daily contact with patient to assess and evaluate symptoms and progress in treatment and Medication management  Encourage ongoing group and milieu participation to work on coping skills and symptom reduction Will continue Haldol 2 mg po bid for psychosis. Will continue  Cogentin 0.5 mg po bid for EPS. Will continue Lamictal 25 mg po qpm for mood disorder Will continue Trazodone 100 mg po qhs for sleep. Treatment team working on disposition planning   Paul Mejia, Paul I, NP,  PMHNP, FNP-BC. 09/15/2015, 2:16 PM  Agree with NP Progress Note, as above

## 2015-09-16 MED ORDER — LAMOTRIGINE 25 MG PO TABS
25.0000 mg | ORAL_TABLET | Freq: Two times a day (BID) | ORAL | Status: DC
  Administered 2015-09-16 – 2015-09-17 (×2): 25 mg via ORAL
  Filled 2015-09-16 (×6): qty 1

## 2015-09-16 MED ORDER — HALOPERIDOL 1 MG PO TABS
1.0000 mg | ORAL_TABLET | Freq: Every day | ORAL | Status: DC
  Administered 2015-09-16: 1 mg via ORAL
  Filled 2015-09-16 (×3): qty 1
  Filled 2015-09-16: qty 2

## 2015-09-16 MED ORDER — HALOPERIDOL 2 MG PO TABS
2.0000 mg | ORAL_TABLET | Freq: Every morning | ORAL | Status: DC
  Administered 2015-09-17: 2 mg via ORAL
  Filled 2015-09-16 (×3): qty 1

## 2015-09-16 NOTE — Progress Notes (Signed)
Pt invited to attend karaoke, but declined. 

## 2015-09-16 NOTE — Progress Notes (Signed)
Patient ID: Paul Mejia, male   DOB: 05/17/1960, 55 y.o.   MRN: [id number] [facility] MD Progress Note  09/16/2015 5:32 PM Govanni Plemons  MRN:  [id number]  Subjective:  Patient reports he is feeling "OK". He denies medication side effects at this time. As patient improves he is focusing on discharge planning . States he is ambivalent about returning to live with his family. States he has no homicidal or violent ideations towards his nephew, and has no plan or intention of any violence towards anyone. States " the problem is my family knows I got money, so they use me ". States , for example, that his nephew was buying drugs and telling drug dealer that patient would pay him.   Objective: Patient seen and case discussed with treatment team. Patient is currently improved compared to admission, mood is improved , and denies feeling depressed. Currently denies hallucinations,no delusions expressed, and as noted above, at this time denies any homicidal or violent ideations towards his nephew or anyone else . Behavior on unit in good control. Tolerating Haldol and Lamictal well thus far. Very slight involuntary " twitches" on hands reported, noted, but no dystonia, no oro-buccal abnormal movements , no trunk abnormal movements . We reviewed side effect profile for medications, to include risk of dystonic reactions and TD on Haldol .  Principal Problem: Schizophrenia, paranoid type (HCC)  Diagnosis:   Patient Active Problem List   Diagnosis Date Noted  . Paranoid schizophrenia (HCC) [F20.0] 09/08/2015  . Schizophrenia, paranoid type (HCC) [F20.0] 09/08/2015   Total Time spent with patient: 20 minutes   Past Psychiatric History: Schizophrenia  Past Medical History:  Past Medical History  Diagnosis Date  . Schizophrenia (HCC)    History reviewed. No pertinent past surgical history. Family History:  Family History  Problem Relation Age of Onset  . Mental illness Neg Hx    Family Psychiatric   History: Please see H&P.  Social History:  History  Alcohol Use No     History  Drug Use No    Social History   Social History  . Marital Status: Single    Spouse Name: N/A  . Number of Children: N/A  . Years of Education: N/A   Social History Main Topics  . Smoking status: Current Every Day Smoker  . Smokeless tobacco: None  . Alcohol Use: No  . Drug Use: No  . Sexual Activity: Not Asked   Other Topics Concern  . None   Social History Narrative   Additional Social History:    History of alcohol / drug use?: No history of alcohol / drug abuse  Sleep: improved   Appetite:  Improved   Current Medications: Current Facility-Administered Medications  Medication Dose Route Frequency Provider Last Rate Last Dose  . alum & mag hydroxide-simeth (MAALOX/MYLANTA) 200-200-20 MG/5ML suspension 30 mL  30 mL Oral Q4H PRN Charm Rings, NP   30 mL at 09/11/15 0037  . benztropine (COGENTIN) tablet 0.5 mg  0.5 mg Oral BID Charm Rings, NP   0.5 mg at 09/16/15 0847  . haloperidol (HALDOL) tablet 2 mg  2 mg Oral BID Charm Rings, NP   2 mg at 09/16/15 0847  . hydrOXYzine (ATARAX/VISTARIL) tablet 25 mg  25 mg Oral Q6H PRN Charm Rings, NP   25 mg at 09/15/15 2204  . ibuprofen (ADVIL,MOTRIN) tablet 400 mg  400 mg Oral Q6H PRN Jomarie Longs, MD   400 mg at 09/12/15 0424  . lamoTRIgine (  LAMICTAL) tablet 25 mg  25 mg Oral BID Rockey Situ Tarin Navarez, MD      . magnesium hydroxide (MILK OF MAGNESIA) suspension 30 mL  30 mL Oral Daily PRN Charm Rings, NP      . OLANZapine zydis (ZYPREXA) disintegrating tablet 5 mg  5 mg Oral TID PRN Jomarie Longs, MD       Or  . OLANZapine (ZYPREXA) injection 5 mg  5 mg Intramuscular TID PRN Jomarie Longs, MD      . traZODone (DESYREL) tablet 100 mg  100 mg Oral QHS Charm Rings, NP   100 mg at 09/15/15 2204   Lab Results:  No results found for this or any previous visit (from the past 48 hour(s)). Blood Alcohol level:  Lab Results  Component  Value Date   ETH <5 09/07/2015   ETH <5 05/05/2015   Metabolic Disorder Labs: Lab Results  Component Value Date   HGBA1C 5.7* 09/10/2015   MPG 117 09/10/2015   Lab Results  Component Value Date   PROLACTIN 20.6* 09/10/2015   Lab Results  Component Value Date   CHOL 195 09/10/2015   TRIG 116 09/10/2015   HDL 30* 09/10/2015   CHOLHDL 6.5 09/10/2015   VLDL 23 09/10/2015   LDLCALC 142* 09/10/2015   Physical Findings: AIMS: Facial and Oral Movements Muscles of Facial Expression: None, normal Lips and Perioral Area: None, normal Jaw: None, normal Tongue: None, normal,Extremity Movements Upper (arms, wrists, hands, fingers): None, normal Lower (legs, knees, ankles, toes): None, normal, Trunk Movements Neck, shoulders, hips: None, normal, Overall Severity Severity of abnormal movements (highest score from questions above): None, normal Incapacitation due to abnormal movements: None, normal Patient's awareness of abnormal movements (rate only patient's report): No Awareness, Dental Status Current problems with teeth and/or dentures?: No Does patient usually wear dentures?: No  CIWA:    COWS:     Musculoskeletal: Strength & Muscle Tone: within normal limits Gait & Station: normal Patient leans: N/A  Psychiatric Specialty Exam: Physical Exam  Nursing note and vitals reviewed.   Review of Systems  Constitutional: Negative.   HENT: Negative.   Eyes: Negative.   Respiratory: Negative.   Cardiovascular: Negative.   Gastrointestinal: Negative.   Genitourinary: Negative.   Musculoskeletal: Negative.   Skin: Negative.   Neurological: Negative.   Endo/Heme/Allergies: Negative.   Psychiatric/Behavioral: The patient is nervous/anxious.   All other systems reviewed and are negative. denies headache, denies chest pain, denies shortness of breath  Blood pressure 112/84, pulse 95, temperature 98.2 F (36.8 C), temperature source Oral, resp. rate 20, height 5\' 7"  (1.702 m),  weight 171 lb (77.565 kg), SpO2 100 %.Body mass index is 26.78 kg/(m^2).  General Appearance: Fairly groomed / cooperative, calm   Eye Contact:  Good   Speech:  Normal Rate  Volume:  Normal  Mood:  Currently denies depression, states mood is "OK"  Affect:  Reactive, smiles pleasantly at times   Thought Process: presents better organized    Orientation:  Full (Time, Place, and Person)  Thought Content:  At this time denies hallucinations, no delusions expressed,  Suicidal Thoughts:  No- denies any suicidal or self injurious ideations   Homicidal Thoughts:  Denies homicidal or violent  ideations at present, specifically denies any plan or intention of harming or of HI towards nephew   Memory:  Recent and remote grossly intact   Judgement: improving   Insight:  Improving   Psychomotor Activity:  normal  Concentration:  Concentration: good  and Attention Span: Good  Recall:  Good  Fund of Knowledge:  Good  Language:  Good  Akathisia:  No- currently no akathisia  Handed:  Right  AIMS (if indicated):   no abnormal involuntary movements noted or reported at this time   Assets:  Desire for Improvement  ADL's:  Intact  Cognition:  WNL  Sleep:  Number of Hours: 6.5   Assessment -  Patient is improved compared to admission, and is currently presenting with improved mood, denies depression, and denying any homicidal or violent ideations towards his nephew or anyone else. Behavior on unit in good control. Tolerating medications well , denies side effects, has very slight abnormal movements of fingers at times  , patient states that he has not noticed this . As he improves he is focused on being discharged soon, but is ambivalent on whether to return home or not.  Treatment Plan Summary. Daily contact with patient to assess and evaluate symptoms and progress in treatment and Medication management  Encourage ongoing group and milieu participation to work on coping skills and symptom reduction Will  decrease  Haldol  To 2 mg po am and 1 mgr hs for psychosis. Will continue  Cogentin 0.5 mg po bid for EPS. Will increase  Lamictal to  25 mg po bid for mood disorder Will continue Trazodone 100 mg po qhs for sleep. Treatment team working on disposition planning   Nehemiah MassedOBOS, Nieve Rojero, MD 09/16/2015, 5:32 PM

## 2015-09-16 NOTE — Progress Notes (Signed)
D: Farhan has been calm, cooperative, and cordial today. He voices few needs when given the opportunity to do so. He denied SI/HI/AVH/pain. On his self inventory, he reported good sleep, good appetite, and normal energy level. He rated his depression, hopelessness, and anxiety all zero. He has been visible in the milieu. A: Meds given as ordered. Q15 safety checks maintained. Support/encouragement offered. R: Pt remains free from harm and continues with treatment. Will continue to monitor for needs/safety.

## 2015-09-16 NOTE — [facility] Group Notes (Signed)
[facility] LCSW Group Therapy 09/16/2015 1:15pm  Type of Therapy: Group Therapy- Balance in Life  Participation Level: Reserved  Description of the Group:  The topic for group was balance in life. Today's group focused on defining balance in one's own words, identifying things that can knock one off balance, and exploring healthy ways to maintain balance in life. Group members were asked to provide an example of a time when they felt off balance, describe how they handled that situation,and process healthier ways to regain balance in the future. Group members were asked to share the most important tool for maintaining balance that they [facility] and how they plan to apply this method after discharge.  Summary of Patient Progress Pt participated minimally but responds when prompted. His participation is vague in quality but did identify his life as being "out of balance" prior to admission; he was unable to articulate specific ways in which it was out of balance.    Therapeutic Modalities:   Cognitive Behavioral Therapy Solution-Focused Therapy Assertiveness Training   Chad CordialLauren Carter, Theresia MajorsLCSWA 09/16/2015 7:47 PM

## 2015-09-16 NOTE — Progress Notes (Signed)
Patient ID: [name], male   DOB: 10/31/1960, 55 y.o.   MRN: [id number] PER STATE REGULATIONS 482.30  THIS CHART WAS REVIEWED FOR MEDICAL NECESSITY WITH RESPECT TO THE PATIENT'S ADMISSION/ DURATION OF STAY.  NEXT REVIEW DATE:  09/20/2015  Willa RoughJENNIFER JONES [name], RN, BSN CASE MANAGER

## 2015-09-16 NOTE — Progress Notes (Signed)
D: Pt denies SI/HI/AVH. Pt is pleasant and cooperative. Pt stated he was doing good and ready for D/C  A: Pt was offered support and encouragement. Pt was given scheduled medications. Pt was encourage to attend groups. Q 15 minute checks were done for safety.   R: Pt is taking medication. Pt has no complaints.Pt receptive to treatment and safety maintained on unit.

## 2015-09-17 MED ORDER — BENZTROPINE MESYLATE 0.5 MG PO TABS: 0.5000 mg | ORAL_TABLET | Freq: Two times a day (BID) | ORAL | Status: DC

## 2015-09-17 MED ORDER — TRAZODONE HCL 100 MG PO TABS: 100.0000 mg | ORAL_TABLET | Freq: Every day | ORAL | Status: DC

## 2015-09-17 MED ORDER — HYDROXYZINE HCL 25 MG PO TABS: 25.0000 mg | ORAL_TABLET | Freq: Four times a day (QID) | ORAL | Status: DC | PRN

## 2015-09-17 MED ORDER — HALOPERIDOL 1 MG PO TABS: 1.0000 mg | ORAL_TABLET | Freq: Every day | ORAL | Status: DC

## 2015-09-17 MED ORDER — HALOPERIDOL 2 MG PO TABS: 2.0000 mg | ORAL_TABLET | Freq: Every morning | ORAL | Status: DC

## 2015-09-17 MED ORDER — LAMOTRIGINE 25 MG PO TABS: 25.0000 mg | ORAL_TABLET | Freq: Two times a day (BID) | ORAL | Status: DC

## 2015-09-17 NOTE — Progress Notes (Signed)
  [facility] :  Will you be returning to the same living situation after discharge:  Yes,  Pt returning home At discharge, do you have transportation home?: Yes,  pt provided with bus pass Do you have the ability to pay for your medications: Yes,  Pt provided with prescriptions  Release of information consent forms completed and in the chart;  Patient's signature needed at discharge.  Patient to Follow up at: Follow-up Information    Follow up with [facility] On 09/28/2015.   Why:  Hospital discharge follow up appointment w Tamela OddiJo Hughes on 7/25 at 2:20 PM.  Please call to cancel/reschedule if needed.  Bring hospital discharge paperwork to this appointment.   Contact information:   [address], [place], [place] [address]   Phone # [phone]   Fax # [phone]           Next level of care provider [facility]:no  Safety Planning and Suicide Prevention discussed: N/A; Pt was not suicidal on admission   Have you used any form of tobacco in the last 30 days? (Cigarettes, Smokeless Tobacco, Cigars, and/or Pipes): Yes  Has patient been referred to the Quitline?: Patient refused referral  Patient has been referred for addiction treatment: Yes  Elaina HoopsCarter, Nichola Cieslinski M 09/17/2015, 10:19 AM
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
## 2015-09-17 NOTE — Progress Notes (Signed)
D: Patient verbalizes readiness for discharge. Denies SI/HI.  No complaints of pain.    Pt rates depression, anxiety and hopelessness all at 0/10 today on self inventory.  A:  AVS, SRA, Transition record and prescriptions explained and given, patient attentive and receptive to discharge instructions.  Belongings returned and sent with the pt.  R:  Escorted to the lobby by this RN.

## 2015-09-17 NOTE — Discharge Summary (Signed)
Physician Discharge Summary Note  Patient:  Paul Mejia is an 55 y.o., male MRN:  [id number] DOB:  September 30, 1960 Patient phone:  [phone] (home)  Patient address:   [address] [place] [address],  Total Time spent with patient: 30 minutes  Date of Admission:  09/08/2015 Date of Discharge: 09/17/2015  Reason for Admission:    Principal Problem: Schizophrenia, paranoid type [facility]) Discharge Diagnoses: Patient Active Problem List   Diagnosis Date Noted  . Paranoid schizophrenia (HCC) [F20.0] 09/08/2015  . Schizophrenia, paranoid type (HCC) [F20.0] 09/08/2015    Past Psychiatric History:  see HPI  Past Medical History:  Past Medical History  Diagnosis Date  . Schizophrenia (HCC)    History reviewed. No pertinent past surgical history. Family History:  Family History  Problem Relation Age of Onset  . Mental illness Neg Hx    Family Psychiatric  History:  See HPI Social History:  History  Alcohol Use No     History  Drug Use No    Social History   Social History  . Marital Status: Single    Spouse Name: N/A  . Number of Children: N/A  . Years of Education: N/A   Social History Main Topics  . Smoking status: Current Every Day Smoker  . Smokeless tobacco: None  . Alcohol Use: No  . Drug Use: No  . Sexual Activity: Not Asked   Other Topics Concern  . None   Social History Narrative   Hospital Course:   Paul Mejia was admitted for Schizophrenia, paranoid type (HCC) and crisis management.  He was treated with Haldol 3 mg for mood stabilization and Lamictal 25 mg mood stabilization .  Medical problems were identified and treated as needed.  Home medications were restarted as appropriate.  Improvement was monitored by observation and Paul Mejia daily report of symptom reduction.  Emotional and mental status was monitored by daily self inventory reports completed by Paul Mejia and clinical staff.  Patient reported continued improvement, denied  any new concerns.  Patient had been compliant on medications and denied side effects.  Support and encouragement was provided.    At time of discharge, patient rated both depression and anxiety levels to be manageable and minimal.  Patient encouraged to attend groups to help with recognizing triggers of emotional crises and de-stabilizations.  Patient encouraged to attend group to help identify the positive things in life that would help in dealing with feelings of loss, depression and unhealthy or abusive tendencies.         Paul Mejia was evaluated by the treatment team for stability and plans for continued recovery upon discharge.  He was offered further treatment options upon discharge including Residential, Intensive Outpatient and Outpatient treatment.  He will follow up with agency listed below for medication management and counseling.  Encouraged patient to maintain satisfactory support network and home environment.  Advised to adhere to medication compliance and outpatient treatment follow up.  Prescriptions provided.       Paul Mejia motivation was an integral factor for scheduling further treatment.  Employment, transportation, bed availability, health status, family support, and any pending legal issues were also considered during his hospital stay.  Upon completion of this admission the patient was both mentally and medically stable for discharge denying suicidal/homicidal ideation, auditory/visual/tactile hallucinations, delusional thoughts and paranoia.      Physical Findings: AIMS: Facial and Oral Movements Muscles of Facial Expression: None, normal Lips and Perioral Area: None, normal Jaw: None, normal Tongue: None,  normal,Extremity Movements Upper (arms, wrists, hands, fingers): None, normal Lower (legs, knees, ankles, toes): None, normal, Trunk Movements Neck, shoulders, hips: None, normal, Overall Severity Severity of abnormal movements (highest score from questions  above): None, normal Incapacitation due to abnormal movements: None, normal Patient's awareness of abnormal movements (rate only patient's report): No Awareness, Dental Status Current problems with teeth and/or dentures?: No Does patient usually wear dentures?: No  CIWA:    COWS:     Musculoskeletal: Strength & Muscle Tone: within normal limits Gait & Station: normal Patient leans: N/A  Psychiatric Specialty Exam:  SEE MD SRA Physical Exam  Nursing note and vitals reviewed. Psychiatric: He has a normal mood and affect. His behavior is normal. Judgment and thought content normal. Cognition and memory are normal.    Review of Systems  Psychiatric/Behavioral: Negative.   All other systems reviewed and are negative.   Blood pressure 128/79, pulse 72, temperature 98 F (36.7 C), temperature source Oral, resp. rate 16, height  (1.702 m), weight 77.565 kg (171 lb), SpO2 100 %.Body mass index is 26.78 kg/(m^2).    Have you used any form of tobacco in the last 30 days? (Cigarettes, Smokeless Tobacco, Cigars, and/or Pipes): Yes  Has this patient used any form of tobacco in the last 30 days? (Cigarettes, Smokeless Tobacco, Cigars, and/or Pipes) Yes, NA  Blood Alcohol level:  Lab Results  Component Value Date   [facility] <5 09/07/2015   ETH <5 05/05/2015    Metabolic Disorder Labs:  Lab Results  Component Value Date   HGBA1C 5.7* 09/10/2015   MPG 117 09/10/2015   Lab Results  Component Value Date   PROLACTIN 20.6* 09/10/2015   Lab Results  Component Value Date   CHOL 195 09/10/2015   TRIG 116 09/10/2015   HDL 30* 09/10/2015   CHOLHDL 6.5 09/10/2015   VLDL 23 09/10/2015   LDLCALC 142* 09/10/2015    See Psychiatric Specialty Exam and Suicide Risk Assessment completed by Attending Physician prior to discharge.  Discharge destination:  Home  Is patient on multiple antipsychotic therapies at discharge:  No   Has Patient had three or more failed trials of antipsychotic  monotherapy by history:  No  Recommended Plan for Multiple Antipsychotic Therapies: NA     Medication List    TAKE these medications      Indication   benztropine 0.5 MG tablet  Commonly known as:  COGENTIN  Take 1 tablet (0.5 mg total) by mouth 2 (two) times daily.   Indication:  Extrapyramidal Reaction caused by Medications     haloperidol 1 MG tablet  Commonly known as:  HALDOL  Take 1 tablet (1 mg total) by mouth at bedtime.   Indication:  mood stabilization     haloperidol 2 MG tablet  Commonly known as:  HALDOL  Take 1 tablet (2 mg total) by mouth every morning.   Indication:  Psychosis, Schizophrenia     hydrOXYzine 25 MG tablet  Commonly known as:  ATARAX/VISTARIL  Take 1 tablet (25 mg total) by mouth every 6 (six) hours as needed for anxiety.   Indication:  Anxiety Neurosis     lamoTRIgine 25 MG tablet  Commonly known as:  LAMICTAL  Take 1 tablet (25 mg total) by mouth 2 (two) times daily.   Indication:  mood stabilization     traZODone 100 MG tablet  Commonly known as:  DESYREL  Take 1 tablet (100 mg total) by mouth at bedtime.   Indication:  Trouble  Sleeping           Follow-up Information    Follow up with [facility] On 09/28/2015.   Why:  Hospital discharge follow up appointment w Tamela OddiJo Hughes on 7/25 at 2:20 PM.  Please call to cancel/reschedule if needed.  Bring hospital discharge paperwork to this appointment.   Contact information:   [address], [place], [place] [address]   Phone # [phone]   Fax # [phone]          Follow-up recommendations:  Activity:  as tol Diet:  as tol  Comments:  1.  Take all your medications as prescribed.   2.  Report any adverse side effects to outpatient provider. 3.  Patient instructed to not use alcohol or illegal drugs while on prescription medicines. 4.  In the event of worsening symptoms, instructed patient to call 911, the crisis hotline or go to nearest emergency room  for evaluation of symptoms.  Signed: Lindwood QuaSheila May Agustin, NP [facility] 09/17/2015, 11:28 AM   Patient seen, Suicide Assessment Completed.  Disposition Plan Reviewed
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
## 2016-04-06 ENCOUNTER — Encounter (HOSPITAL_COMMUNITY): Payer: Self-pay | Admitting: Emergency Medicine

## 2016-04-06 ENCOUNTER — Emergency Department (HOSPITAL_COMMUNITY)
Admission: EM | Admit: 2016-04-06 | Discharge: 2016-04-08 | Disposition: A | Discharge status: Home / Self Care | Payer: Medicare Other | Attending: Emergency Medicine | Admitting: Emergency Medicine

## 2016-04-06 DIAGNOSIS — R443 Hallucinations, unspecified: Secondary | ICD-10-CM | POA: Diagnosis present

## 2016-04-06 DIAGNOSIS — Z79899 Other long term (current) drug therapy: Secondary | ICD-10-CM | POA: Diagnosis not present

## 2016-04-06 DIAGNOSIS — F69 Unspecified disorder of adult personality and behavior: Secondary | ICD-10-CM

## 2016-04-06 DIAGNOSIS — F17200 Nicotine dependence, unspecified, uncomplicated: Secondary | ICD-10-CM | POA: Diagnosis not present

## 2016-04-06 DIAGNOSIS — F17210 Nicotine dependence, cigarettes, uncomplicated: Secondary | ICD-10-CM | POA: Diagnosis not present

## 2016-04-06 DIAGNOSIS — I10 Essential (primary) hypertension: Secondary | ICD-10-CM | POA: Diagnosis not present

## 2016-04-06 DIAGNOSIS — F200 Paranoid schizophrenia: Secondary | ICD-10-CM | POA: Diagnosis present

## 2016-04-06 DIAGNOSIS — F99 Mental disorder, not otherwise specified: Secondary | ICD-10-CM

## 2016-04-06 HISTORY — DX: Essential (primary) hypertension: I10

## 2016-04-06 LAB — CBC
HEMATOCRIT: 44.1 % (ref 39.0–52.0)
HEMOGLOBIN: 14.7 g/dL (ref 13.0–17.0)
MCH: 26.3 pg (ref 26.0–34.0)
MCHC: 33.3 g/dL (ref 30.0–36.0)
MCV: 78.8 fL (ref 78.0–100.0)
Platelets: 369 10*3/uL (ref 150–400)
RBC: 5.6 MIL/uL (ref 4.22–5.81)
RDW: 15.7 % — ABNORMAL HIGH (ref 11.5–15.5)
WBC: 6.4 10*3/uL (ref 4.0–10.5)

## 2016-04-06 LAB — RAPID URINE DRUG SCREEN, HOSP PERFORMED
Amphetamines: NOT DETECTED
BARBITURATES: NOT DETECTED
Benzodiazepines: NOT DETECTED
Cocaine: NOT DETECTED
Opiates: NOT DETECTED
TETRAHYDROCANNABINOL: NOT DETECTED

## 2016-04-06 LAB — COMPREHENSIVE METABOLIC PANEL
ALK PHOS: 98 U/L (ref 38–126)
ALT: 12 U/L — AB (ref 17–63)
AST: 17 U/L (ref 15–41)
Albumin: 4 g/dL (ref 3.5–5.0)
Anion gap: 9 (ref 5–15)
BUN: 13 mg/dL (ref 6–20)
CALCIUM: 9.2 mg/dL (ref 8.9–10.3)
CO2: 26 mmol/L (ref 22–32)
CREATININE: 1.28 mg/dL — AB (ref 0.61–1.24)
Chloride: 98 mmol/L — ABNORMAL LOW (ref 101–111)
GFR calc non Af Amer: >60 mL/min (ref >60)
GLUCOSE: 193 mg/dL — AB (ref 65–99)
Potassium: 3.4 mmol/L — ABNORMAL LOW (ref 3.5–5.1)
SODIUM: 133 mmol/L — AB (ref 135–145)
Total Bilirubin: 0.4 mg/dL (ref 0.3–1.2)
Total Protein: 8.1 g/dL (ref 6.5–8.1)

## 2016-04-06 LAB — ETHANOL: Alcohol, Ethyl (B): <5 mg/dL (ref <5)

## 2016-04-06 LAB — ACETAMINOPHEN LEVEL: Acetaminophen (Tylenol), Serum: <10 ug/mL — ABNORMAL LOW (ref 10–30)

## 2016-04-06 LAB — SALICYLATE LEVEL

## 2016-04-06 NOTE — ED Notes (Signed)
Bed: WA27 Expected date:  Expected time:  Means of arrival:  Comments: IVC 
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
## 2016-04-06 NOTE — ED Notes (Signed)
Patient denies SI,HI and AVH at this time. Plan of care discussed with patient. Patient voices no complaints or concerns at this time. Encouragement and support provided and safety maintain. Q 15 min safety checks remain in place and video monitoring.   

## 2016-04-06 NOTE — ED Notes (Signed)
Pt admitted to room #39. Pt behavior cooperative. Pt denies SI/HI/AVH. Reports to this nurse he has been off his medication. Encouragement and support provided. Special checks q 15 mins in place for safety. Video monitoring in place. Will continue to monitor.

## 2016-04-06 NOTE — BH Assessment (Signed)
Assessment Note  Paul Mejia is an 56 y.o. male with history of Schizophrenia. Patient presenting to [facility] under IVC. Per IVC patient threatening family with knife and talking to people that don't exist. IVC also sts that patient expressedthat people are trying to hurt him. Patient sts that he was sitting at home minding his business and police showed up to his home. He lives in a home with his sister, mother, nieces, nephews, and brother. Sts that he gets along with all family members and doesn't understand why they would make up lies. Patient denies that he has threatened his family members. Sts that he doesn't work and stays to himself in the home on a daily basis. He denies suicidal ideations. He denies history of suicidal attempts or gestures. He denies depressive symptoms. Sts, "I am actually pretty much happy". He denies stressors. He denies depressive symptoms. He denies a family history of mental health history. He denies HI and history of violent behaviors to others. No legal issues reported. No access to firearms of weapons other than kitchen knives. He does admit to visual hallucinations of "Microscopic pictures of various people he knows and doesn't know". He also has auditory hallucinations of "people chattering". Sts that he hears people talking in the [facility]. Sts, "I hear them talking but I stay to myself because I don't want to be nosey". He seeks outpatient mental health treatment with [facility]/ Tamela Oddi. He reports non compliance with oral psych medications. Sts, "I don't like my psych medications because they make me sleep all day". He has tried injections in the past but doesn't like the way they make his legs ache. He has received INPT treatment at [facility] 09/08/2015.   Diagnosis: Schizophrenia  Past Medical History:  Past Medical History:  Diagnosis Date  . Hypertension   . Schizophrenia (HCC)     History reviewed. No pertinent surgical history.  Family History:   Family History  Problem Relation Age of Onset  . Mental illness Neg Hx     Social History:  reports that he has been smoking.  He has never used smokeless tobacco. He reports that he does not drink alcohol or use drugs.  Additional Social History:  Alcohol / Drug Use Pain Medications: SEE MAR Prescriptions: SEE MAR Over the Counter: SEE MAR History of alcohol / drug use?: No history of alcohol / drug abuse  CIWA: CIWA-Ar BP: 129/82 Pulse Rate: 83 COWS:    Allergies: No Known Allergies  Home Medications:  (Not in a hospital admission)  OB/GYN Status:  No LMP for male patient.  General Assessment Data Location of Assessment: [facility] ED TTS Assessment: In system Is this a Tele or Face-to-Face Assessment?: Face-to-Face Is this an Initial Assessment or a Re-assessment for this encounter?: Initial Assessment Marital status: Single Maiden name:  (n/a) Is patient pregnant?: No Pregnancy Status: No Living Arrangements: Other relatives (mother, sister, sisters boyfriend) Can pt return to current living arrangement?: No Admission Status: Involuntary Is patient capable of signing voluntary admission?: No Referral Source: Self/Family/Friend Insurance type:  (Medicare)     Crisis Care Plan Living Arrangements: Other relatives (mother, sister, sisters boyfriend) [employer] operational [employer] Guardian: Other: (no legal guardian ) Name of Psychiatrist:  (no psychiatrist ) Name of Therapist:  (no therapist )  Education Status Is patient currently in school?: No Current Grade:  (n/a) Highest grade of school patient has completed:  (n/a) Name of school:  (n/a) Contact person:  (n/a)  Risk to self with the past  6 months Suicidal Ideation: No Has patient been a risk to self within the past 6 months prior to admission? : No Suicidal Intent: No Has patient had any suicidal intent within the past 6 months prior to admission? : No Is patient at risk for suicide?: No Suicidal Plan?: No Has patient had any  suicidal plan within the past 6 months prior to admission? : No Access to Means: No What has been your use of drugs/alcohol within the last 12 months?:  (denies ) Previous Attempts/Gestures: No How many times?:  (0) Other Self Harm Risks:  (denies self harm ) Triggers for Past Attempts: Other (Comment) (no previous attempts or gestures) Intentional Self Injurious Behavior: None Family Suicide History: No Recent stressful life event(s): Other (Comment) ("No..Marland KitchenI am actually pretty happy right now") Persecutory voices/beliefs?: No Depression: Yes Depression Symptoms:  (no depressive symptoms) Substance abuse history and/or treatment for substance abuse?: No Suicide prevention information given to non-admitted patients: Not applicable  Risk to Others within the past 6 months Homicidal Ideation: No Does patient have any lifetime risk of violence toward others beyond the six months prior to admission? : No Thoughts of Harm to Others: No Current Homicidal Intent: No Current Homicidal Plan: No Access to Homicidal Means: No Identified Victim:  (n/a) History of harm to others?: No Assessment of Violence: None Noted Violent Behavior Description:  (patient is calm and cooperate) Does patient have access to weapons?: No Criminal Charges Pending?: No Does patient have a court date: No Is patient on probation?: No   He does admit to visual hallucinations of "Microscopic pictures of various people he knows and doesn't know". He also has auditory hallucinations of "people chattering". Sts that he hears people talking in the [facility]. Sts, "I hear them talking but I stay to myself because I don't want to be nosey".  Mental Status Report Appearance/Hygiene: In scrubs Eye Contact: Good Motor Activity: Freedom of movement Speech: Logical/coherent Level of Consciousness: Alert Mood: Depressed Affect: Appropriate to circumstance Anxiety Level: None Thought Processes: Coherent,  Relevant Judgement: Impaired Orientation: Person, Place, Time, Situation Obsessive Compulsive Thoughts/Behaviors: None  Cognitive Functioning Concentration: Decreased Memory: Recent Intact, Remote Intact IQ: Average Insight: Fair Impulse Control: Fair Appetite: Good Weight Loss:  (none reported) Weight Gain:  (none reported) Sleep: Decreased Total Hours of Sleep:  (varies; 6 to 8 hrs of sleep) Vegetative Symptoms: None  ADLScreening [facility] Assessment Services) Patient's cognitive ability adequate to safely complete daily activities?: Yes Patient able to express need for assistance with ADLs?: Yes Independently performs ADLs?: Yes (appropriate for developmental age)  Prior Inpatient Therapy Prior Inpatient Therapy: Yes Prior Therapy Dates:  (09/18/15) Prior Therapy Facilty/Provider(s):  [facility]) Reason for Treatment:  (Paranoid Schizophrenia)  Prior Outpatient Therapy Prior Outpatient Therapy: Yes Prior Therapy Dates:  (current) Prior Therapy Facilty/Provider(s):  (outpatient services with [facility]) Reason for Treatment:  (medication managmement) Does patient have an ACCT team?: No Does patient have Intensive In-House Services?  : No Does patient have [facility] services? : No Does patient have P4CC services?: No  ADL Screening (condition at time of admission) Patient's cognitive ability adequate to safely complete daily activities?: Yes Is the patient deaf or have difficulty hearing?: No Does the patient have difficulty seeing, even when wearing glasses/contacts?: No Does the patient have difficulty concentrating, remembering, or making decisions?: Yes Patient able to express need for assistance with ADLs?: Yes Does the patient have difficulty dressing or bathing?: No Independently performs ADLs?: Yes (appropriate for  developmental age) Does the patient have difficulty walking or climbing stairs?: No Weakness of Legs: None Weakness of Arms/Hands:  None  Home Assistive Devices/Equipment Home Assistive Devices/Equipment: None    Abuse/Neglect Assessment (Assessment to be complete while patient is alone) Physical Abuse: Denies Verbal Abuse: Denies Sexual Abuse: Denies Exploitation of patient/patient's resources: Denies Self-Neglect: Denies Values / Beliefs Cultural Requests During Hospitalization: None Spiritual Requests During Hospitalization: None   Advance Directives (For Healthcare) Does Patient Have a Medical Advance Directive?: No Would patient like information on creating a medical advance directive?: No - Patient declined Nutrition Screen- [facility] Adult/[facility] Patient's home diet: Regular  Additional Information 1:1 In Past 12 Months?: No CIRT Risk: No Elopement Risk: No Does patient have medical clearance?: Yes     Disposition:  Disposition Initial Assessment Completed for this Encounter: Yes (Per Nanine MeansJamison Lord, DNP, patient to remain in ED overnight) Disposition of Patient: Other dispositions (Pending am psych evaluation)  On Site Evaluation by:   Reviewed with Physician:    Melynda Rippleoyka Yen Wandell 04/06/2016 3:57 PM

## 2016-04-06 NOTE — ED Provider Notes (Signed)
[facility]-EMERGENCY DEPT Provider Note   CSN: [id number] Arrival date & time: 04/06/16  1307     History   Chief Complaint Chief Complaint  Patient presents with  . Hallucinations    HPI Paul Mejia is a 56 y.o. male.  The history is provided by the patient and medical records. No language interpreter was used.   Paul Mejia is a 56 y.o. male  who presents to the Emergency Department Under IVC after threatening his family with a knife. Per IVC paperwork, he has been talking to people who are not there and expressing that people are trying to hurt him.Patient states that he was sitting in his home, watching TV, when the police came into his house and told him to come with him to the emergency department. He has no complaints at this time. He denies alcohol or drug use. He does endorse cigarette smoking. Denies suicidal or homicidal ideations, aggressive behavior, auditory or visual hallucinations.   Past Medical History:  Diagnosis Date  . Hypertension   . Schizophrenia [facility](HCC)     Patient Active Problem List   Diagnosis Date Noted  . Paranoid schizophrenia (HCC) 09/08/2015  . Schizophrenia, paranoid type (HCC) 09/08/2015    History reviewed. No pertinent surgical history.     Home Medications    Prior to Admission medications   Medication Sig Start Date End Date Taking? Authorizing Provider  benztropine (COGENTIN) 0.5 MG tablet Take 1 tablet (0.5 mg total) by mouth 2 (two) times daily. 09/17/15  Yes Adonis BrookSheila Agustin, NP  haloperidol (HALDOL) 1 MG tablet Take 1 tablet (1 mg total) by mouth at bedtime. 09/17/15  Yes Adonis BrookSheila Agustin, NP  haloperidol (HALDOL) 2 MG tablet Take 1 tablet (2 mg total) by mouth every morning. 09/17/15  Yes Adonis BrookSheila Agustin, NP  hydrOXYzine (ATARAX/VISTARIL) 25 MG tablet Take 1 tablet (25 mg total) by mouth every 6 (six) hours as needed for anxiety. 09/17/15  Yes Adonis BrookSheila Agustin, NP  lamoTRIgine (LAMICTAL) 25 MG tablet Take 1 tablet (25 mg total) by mouth 2  (two) times daily. 09/17/15  Yes Adonis BrookSheila Agustin, NP  QUEtiapine (SEROQUEL) 100 MG tablet Take 100 mg by mouth daily as needed for agitation. 02/19/16  Yes Historical Provider, MD  traZODone (DESYREL) 150 MG tablet Take 150 mg by mouth at bedtime. 03/13/16  Yes Historical Provider, MD  traZODone (DESYREL) 100 MG tablet Take 1 tablet (100 mg total) by mouth at bedtime. Patient not taking: Reported on 04/06/2016 09/17/15   Adonis BrookSheila Agustin, NP    Family History Family History  Problem Relation Age of Onset  . Mental illness Neg Hx     Social History Social History  Substance Use Topics  . Smoking status: Current Every Day Smoker  . Smokeless tobacco: Never Used  . Alcohol use No     Allergies   Patient has no known allergies.   Review of Systems Review of Systems  Constitutional: Negative for fever.  HENT: Negative for congestion.   Eyes: Negative for visual disturbance.  Respiratory: Negative for cough and shortness of breath.   Cardiovascular: Negative for chest pain.  Gastrointestinal: Negative for abdominal pain, nausea and vomiting.  Genitourinary: Negative for dysuria.  Musculoskeletal: Negative for back pain and neck pain.  Neurological: Negative for headaches.  Psychiatric/Behavioral: Negative for suicidal ideas.     Physical Exam Updated Vital Signs BP 129/82 (BP Location: Left Arm)   Pulse 83   Temp 98.3 F (36.8 C) (Oral)   Resp 18   SpO2  96%   Physical Exam  Constitutional: He is oriented to person, place, and time. He appears well-developed and well-nourished. No distress.  Calm, cooperative.   HENT:  Head: Normocephalic and atraumatic.  Cardiovascular: Normal rate, regular rhythm and normal heart sounds.   No murmur heard. Pulmonary/Chest: Effort normal and breath sounds normal. No respiratory distress.  Abdominal: Soft. He exhibits no distension. There is no tenderness.  Musculoskeletal: He exhibits no edema.  Neurological: He is alert and oriented to  person, place, and time.  Skin: Skin is warm and dry.  Nursing note and vitals reviewed.    ED Treatments / Results  Labs (all labs ordered are listed, but only abnormal results are displayed) Labs Reviewed  COMPREHENSIVE METABOLIC PANEL - Abnormal; Notable for the following:       Result Value   Sodium 133 (*)    Potassium 3.4 (*)    Chloride 98 (*)    Glucose, Bld 193 (*)    Creatinine, Ser 1.28 (*)    ALT 12 (*)    All other components within normal limits  ACETAMINOPHEN LEVEL - Abnormal; Notable for the following:    Acetaminophen (Tylenol), Serum <10 (*)    All other components within normal limits  CBC - Abnormal; Notable for the following:    RDW 15.7 (*)    All other components within normal limits  ETHANOL  SALICYLATE LEVEL  RAPID URINE DRUG SCREEN, HOSP PERFORMED    EKG  EKG Interpretation None       Radiology No results found.  Procedures Procedures (including critical care time)  Medications Ordered in ED Medications - No data to display   Initial Impression / Assessment and Plan / ED Course  I have reviewed the triage vital signs and the nursing notes.  Pertinent labs & imaging results that were available during my care of the patient were reviewed by me and considered in my medical decision making (see chart for details).    Paul Mejia is a 56 y.o. male who presents to ED under IVC after threatening family with a knife. On exam, patient is calm and cooperative, denying SI/HI and auditory/visual hallucinations. Labs review mild hyponatremia and hypokalemia. Creatinine elevated at 1.28. Slight derangements do seem c/w baseline lab work in the past. Strongly encourage PCP follow up in 1 week for recheck and discussion of elevated glucose. Medically cleared and disposition per TTS recommendations.   Final Clinical Impressions(s) / ED Diagnoses   Final diagnoses:  None    New Prescriptions New Prescriptions   No medications on file       [facility] Gennette Shadix, PA-C 04/06/16 1603    Rolland Porter, MD 04/14/16 2311

## 2016-04-07 DIAGNOSIS — F200 Paranoid schizophrenia: Secondary | ICD-10-CM | POA: Diagnosis not present

## 2016-04-07 DIAGNOSIS — F17210 Nicotine dependence, cigarettes, uncomplicated: Secondary | ICD-10-CM

## 2016-04-07 DIAGNOSIS — Z79899 Other long term (current) drug therapy: Secondary | ICD-10-CM

## 2016-04-07 MED ORDER — HALOPERIDOL 1 MG PO TABS: 1.0000 mg | ORAL_TABLET | Freq: Every day | ORAL | Status: DC

## 2016-04-07 MED ORDER — TRAZODONE HCL 100 MG PO TABS
100.0000 mg | ORAL_TABLET | Freq: Every evening | ORAL | Status: DC | PRN
  Administered 2016-04-07: 100 mg via ORAL
  Filled 2016-04-07: qty 1

## 2016-04-07 MED ORDER — HALOPERIDOL 2 MG PO TABS
2.0000 mg | ORAL_TABLET | Freq: Two times a day (BID) | ORAL | Status: DC
  Administered 2016-04-07 – 2016-04-08 (×3): 2 mg via ORAL
  Filled 2016-04-07 (×3): qty 1

## 2016-04-07 MED ORDER — CARBAMAZEPINE 200 MG PO TABS
200.0000 mg | ORAL_TABLET | Freq: Two times a day (BID) | ORAL | Status: DC
  Administered 2016-04-07 – 2016-04-08 (×2): 200 mg via ORAL
  Filled 2016-04-07 (×2): qty 1

## 2016-04-07 NOTE — BH Assessment (Signed)
[facility]  [facility] in the complex Accepting physician is Dr. Betti Cruzeddy Call report to [phone] Representative was Earney NavyJackie Brown ER Staff, Darel HongJudy If patient does not come tonight call Annice PihJackie at [phone]

## 2016-04-07 NOTE — BH Assessment (Signed)
Under Review: Lanny HurstBrynn Mar, [address], [place], [place], [address], [place], [facility], [place]
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
## 2016-04-07 NOTE — ED Notes (Signed)
Pt observed in room playing checkers with recreational therapist.

## 2016-04-07 NOTE — Progress Notes (Signed)
04/07/16 1342:  LRT introduced self to pt and offered activities.  Pt wanted to play checkers.  LRT played 2 games of checkers with pt.  Pt was pleasant and engaged throughout activity.  Caroll RancherMarjette Viviane Semidey, LRT/CTRS

## 2016-04-07 NOTE — ED Notes (Signed)
Pt compliant with medication regimen. Pt behavior cooperative. Pt denies SI/HI/AVH. Encouragement and support provided. Special checks q 15 mins in place for safety. Video monitoring in place. Will continue to monitor.

## 2016-04-07 NOTE — ED Notes (Signed)
SBAR Report received from previous nurse. Pt received calm and visible on unit. Pt denies current SI/ HI, A/V H,  or pain at this time, and rates anxiety and depression as "very low right now. Pt appears otherwise stable and free of distress. Pt reminded of camera surveillance, q 15 min rounds, and rules of the milieu. Will continue to assess.

## 2016-04-08 DIAGNOSIS — F200 Paranoid schizophrenia: Secondary | ICD-10-CM | POA: Diagnosis not present

## 2016-04-08 MED ORDER — HALOPERIDOL 2 MG PO TABS: 2.0000 mg | ORAL_TABLET | Freq: Two times a day (BID) | ORAL | 60 refills | Status: DC

## 2016-04-08 NOTE — ED Notes (Signed)
Sheriff on unit to transfer pt to [facility] per MD order. Personal property given to sheriff for transport. Transfer forms given to sheriff. Pt ambulatory off unit with sheriff.

## 2016-04-08 NOTE — ED Provider Notes (Signed)
Patient has been accepted at [facility] by Dr. Betti Cruzeddy.    Dione Boozeavid Cris Talavera, MD 04/08/16 [phone]

## 2016-04-20 ENCOUNTER — Emergency Department (HOSPITAL_COMMUNITY): Payer: Medicare Other

## 2016-04-20 ENCOUNTER — Encounter (HOSPITAL_COMMUNITY): Payer: Self-pay | Admitting: Emergency Medicine

## 2016-04-20 ENCOUNTER — Emergency Department (HOSPITAL_COMMUNITY)
Admission: EM | Admit: 2016-04-20 | Discharge: 2016-04-20 | Disposition: A | Discharge status: Home / Self Care | Payer: Medicare Other | Attending: Emergency Medicine | Admitting: Emergency Medicine

## 2016-04-20 DIAGNOSIS — R0789 Other chest pain: Secondary | ICD-10-CM | POA: Insufficient documentation

## 2016-04-20 DIAGNOSIS — F17200 Nicotine dependence, unspecified, uncomplicated: Secondary | ICD-10-CM | POA: Diagnosis not present

## 2016-04-20 DIAGNOSIS — I10 Essential (primary) hypertension: Secondary | ICD-10-CM | POA: Diagnosis not present

## 2016-04-20 DIAGNOSIS — R079 Chest pain, unspecified: Secondary | ICD-10-CM

## 2016-04-20 DIAGNOSIS — M79621 Pain in right upper arm: Secondary | ICD-10-CM | POA: Diagnosis not present

## 2016-04-20 LAB — CBC
HCT: 42.3 % (ref 39.0–52.0)
HEMOGLOBIN: 14.3 g/dL (ref 13.0–17.0)
MCH: 26.7 pg (ref 26.0–34.0)
MCHC: 33.8 g/dL (ref 30.0–36.0)
MCV: 78.9 fL (ref 78.0–100.0)
Platelets: 337 10*3/uL (ref 150–400)
RBC: 5.36 MIL/uL (ref 4.22–5.81)
RDW: 15.7 % — ABNORMAL HIGH (ref 11.5–15.5)
WBC: 10.3 10*3/uL (ref 4.0–10.5)

## 2016-04-20 LAB — BASIC METABOLIC PANEL
Anion gap: 12 (ref 5–15)
BUN: 15 mg/dL (ref 6–20)
CALCIUM: 9.3 mg/dL (ref 8.9–10.3)
CO2: 25 mmol/L (ref 22–32)
Chloride: 99 mmol/L — ABNORMAL LOW (ref 101–111)
Creatinine, Ser: 1.11 mg/dL (ref 0.61–1.24)
GFR calc Af Amer: >60 mL/min (ref >60)
GLUCOSE: 95 mg/dL (ref 65–99)
Potassium: 3.9 mmol/L (ref 3.5–5.1)
Sodium: 136 mmol/L (ref 135–145)

## 2016-04-20 LAB — I-STAT TROPONIN, ED: TROPONIN I, POC: 0.01 ng/mL (ref 0.00–0.08)

## 2016-04-20 MED ORDER — ACETAMINOPHEN 325 MG PO TABS
650.0000 mg | ORAL_TABLET | Freq: Once | ORAL | Status: AC
  Administered 2016-04-20: 650 mg via ORAL
  Filled 2016-04-20: qty 2

## 2016-04-20 NOTE — ED Provider Notes (Signed)
[facility]-EMERGENCY DEPT Provider Note   CSN: [id number] Arrival date & time: 04/20/16  0027     History   Chief Complaint Chief Complaint  Patient presents with  . Chest Pain    HPI Paul Mejia is a 56 y.o. male.  Patient presents with complaint of chest pain that started yesterday across his anterior chest. No SOB, nausea or vomiting. No recent fever or cough. The pain was constant until arrival then the pain resolved completely without having tried any medications. Currently, the patient complains of pain in his right arm only. No known injury. The shoulder pain is worse with movement and better in certain positions. The arm has been sore for a couple of days.   The history is provided by the patient. No language interpreter was used.  Chest Pain   Pertinent negatives include no back pain, no diaphoresis, no fever, no nausea, no numbness and no vomiting.    Past Medical History:  Diagnosis Date  . Hypertension   . Schizophrenia [facility](HCC)     Patient Active Problem List   Diagnosis Date Noted  . Paranoid schizophrenia (HCC) 09/08/2015  . Schizophrenia, paranoid type (HCC) 09/08/2015    Past Surgical History:  Procedure Laterality Date  . APPENDECTOMY         Home Medications    Prior to Admission medications   Medication Sig Start Date End Date Taking? Authorizing Provider  benztropine (COGENTIN) 0.5 MG tablet Take 1 tablet (0.5 mg total) by mouth 2 (two) times daily. 09/17/15  Yes Adonis BrookSheila Agustin, NP  haloperidol (HALDOL) 2 MG tablet Take 1 tablet (2 mg total) by mouth 2 (two) times daily. 04/08/16  Yes Shuvon B Rankin, NP  hydrOXYzine (ATARAX/VISTARIL) 25 MG tablet Take 1 tablet (25 mg total) by mouth every 6 (six) hours as needed for anxiety. 09/17/15  Yes Adonis BrookSheila Agustin, NP  lamoTRIgine (LAMICTAL) 25 MG tablet Take 1 tablet (25 mg total) by mouth 2 (two) times daily. 09/17/15  Yes Adonis BrookSheila Agustin, NP  QUEtiapine (SEROQUEL) 100 MG tablet Take 100 mg by mouth daily as  needed for agitation. 02/19/16  Yes Historical Provider, MD  traZODone (DESYREL) 150 MG tablet Take 150 mg by mouth at bedtime. 03/13/16  Yes Historical Provider, MD  haloperidol (HALDOL) 1 MG tablet Take 1 tablet (1 mg total) by mouth at bedtime. Patient not taking: Reported on 04/20/2016 09/17/15   Adonis BrookSheila Agustin, NP  haloperidol (HALDOL) 2 MG tablet Take 1 tablet (2 mg total) by mouth every morning. Patient not taking: Reported on 04/20/2016 09/17/15   Adonis BrookSheila Agustin, NP  traZODone (DESYREL) 100 MG tablet Take 1 tablet (100 mg total) by mouth at bedtime. Patient not taking: Reported on 04/06/2016 09/17/15   Adonis BrookSheila Agustin, NP    Family History Family History  Problem Relation Age of Onset  . Mental illness Neg Hx     Social History Social History  Substance Use Topics  . Smoking status: Current Every Day Smoker  . Smokeless tobacco: Never Used  . Alcohol use No     Allergies   Patient has no known allergies.   Review of Systems Review of Systems  Constitutional: Negative for chills, diaphoresis and fever.  HENT: Negative.   Respiratory: Negative.   Cardiovascular: Positive for chest pain.  Gastrointestinal: Negative.  Negative for nausea and vomiting.  Musculoskeletal: Negative.  Negative for back pain and neck pain.       Right upper arm pain.  Skin: Negative.   Neurological: Negative.  Negative  for numbness.     Physical Exam Updated Vital Signs BP 123/81 (BP Location: Left Arm)   Pulse 106   Temp 98.3 F (36.8 C) (Oral)   Resp 21   Ht 5' 7.5" (1.715 m)   Wt 77.6 kg   SpO2 92%   BMI 26.39 kg/m   Physical Exam  Constitutional: He is oriented to person, place, and time. He appears well-developed and well-nourished.  HENT:  Head: Normocephalic.  Neck: Normal range of motion. Neck supple.  Cardiovascular: Normal rate, regular rhythm and intact distal pulses.   Pulmonary/Chest: Effort normal and breath sounds normal. He has no wheezes. He has no rales. He exhibits  no tenderness.  Abdominal: Soft. Bowel sounds are normal. There is no tenderness. There is no rebound and no guarding.  Musculoskeletal: Normal range of motion.  There is no gross swelling of the right upper arm. He is minimally tender to proximal biceps. There is intact range of motion with 5/5 grip and elbow strength.   Neurological: He is alert and oriented to person, place, and time.  Skin: Skin is warm and dry. No rash noted.  Psychiatric: He has a normal mood and affect.     ED Treatments / Results  Labs (all labs ordered are listed, but only abnormal results are displayed) Labs Reviewed  BASIC METABOLIC PANEL - Abnormal; Notable for the following:       Result Value   Chloride 99 (*)    All other components within normal limits  CBC - Abnormal; Notable for the following:    RDW 15.7 (*)    All other components within normal limits  I-STAT TROPOININ, ED   Results for orders placed or performed during the hospital encounter of 04/20/16  Basic metabolic panel  Result Value Ref Range   Sodium 136 135 - 145 mmol/L   Potassium 3.9 3.5 - 5.1 mmol/L   Chloride 99 (L) 101 - 111 mmol/L   CO2 25 22 - 32 mmol/L   Glucose, Bld 95 65 - 99 mg/dL   BUN 15 6 - 20 mg/dL   Creatinine, Ser 9.56 0.61 - 1.24 mg/dL   Calcium 9.3 8.9 - 21.3 mg/dL   GFR calc non Af Amer >60 >60 mL/min   GFR calc Af Amer >60 >60 mL/min   Anion gap 12 5 - 15  CBC  Result Value Ref Range   WBC 10.3 4.0 - 10.5 K/uL   RBC 5.36 4.22 - 5.81 MIL/uL   Hemoglobin 14.3 13.0 - 17.0 g/dL   HCT 08.6 57.8 - 46.9 %   MCV 78.9 78.0 - 100.0 fL   MCH 26.7 26.0 - 34.0 pg   MCHC 33.8 30.0 - 36.0 g/dL   RDW 62.9 (H) 52.8 - 41.3 %   Platelets 337 150 - 400 K/uL  I-stat troponin, ED  Result Value Ref Range   Troponin i, poc 0.01 0.00 - 0.08 ng/mL   Comment 3            EKG  EKG Interpretation None       Radiology Dg Chest 2 View  Result Date: 04/20/2016 CLINICAL DATA:  Left upper chest pain after a long walk.  Tachycardia and diaphoresis. EXAM: CHEST  2 VIEW COMPARISON:  11/12/2014 CXR FINDINGS: The cardiac silhouette is within normal limits for size. No aortic aneurysm. Lungs are free of pneumonic consolidations, effusions and pneumothoraces. No acute nor suspicious osseous abnormality. IMPRESSION: No active cardiopulmonary disease. Electronically Signed   By: Onalee Hua  Sterling Big M.D.   On: 04/20/2016 01:00    Procedures Procedures (including critical care time)  Medications Ordered in ED Medications - No data to display   Initial Impression / Assessment and Plan / ED Course  I have reviewed the triage vital signs and the nursing notes.  Pertinent labs & imaging results that were available during my care of the patient were reviewed by me and considered in my medical decision making (see chart for details).     The patient presents with chest pain that was constant since yesterday but now resolved. All labs, EKG, CXR without suggestion of ACS. The patient has no history of same.   Pain in the right upper arm follows musculoskeletal pattern and felt unrelated to chest discomfort. The patient appears completely comfortable now. He is felt stable for discharge home.   Final Clinical Impressions(s) / ED Diagnoses   Final diagnoses:  None   1. Nonspecific chest pain, resolved 2. Right upper arm pain.   New Prescriptions New Prescriptions   No medications on file     Elpidio Anis, PA-C 04/20/16 [id number]    Shon Baton, MD 04/20/16 [phone]

## 2016-04-20 NOTE — ED Triage Notes (Signed)
[facility] Pt states they wouldn't give him a room. Pt states while he was walking here he began having left sided chest pain that does not radiate anywhere. Pt is tachycardic. Pt denies SOB. Pt is diaphoretic, but he states he just walked a long distance. Pt has strong bilateral radial pulses and normal S1 and S2 heart sounds. Pt has clear lung sounds

## 2018-04-12 ENCOUNTER — Ambulatory Visit
Admission: RE | Admit: 2018-04-12 | Discharge: 2018-04-12 | Disposition: A | Discharge status: Home / Self Care | Payer: Medicare Other | Source: Ambulatory Visit | Attending: Family Medicine | Admitting: Family Medicine

## 2018-04-12 ENCOUNTER — Other Ambulatory Visit: Payer: Self-pay | Admitting: Family Medicine

## 2018-04-12 DIAGNOSIS — M545 Low back pain, unspecified: Secondary | ICD-10-CM

## 2018-04-12 DIAGNOSIS — G8929 Other chronic pain: Secondary | ICD-10-CM

## 2018-04-12 DIAGNOSIS — M25551 Pain in right hip: Secondary | ICD-10-CM

## 2019-01-15 ENCOUNTER — Other Ambulatory Visit: Payer: Self-pay

## 2019-01-15 ENCOUNTER — Inpatient Hospital Stay (HOSPITAL_COMMUNITY)
Admission: AD | Admit: 2019-01-15 | Discharge: 2019-01-21 | DRG: 885 | Disposition: A | Discharge status: Home / Self Care | Payer: Medicare Other | Attending: Psychiatry | Admitting: Psychiatry

## 2019-01-15 ENCOUNTER — Encounter (HOSPITAL_COMMUNITY): Payer: Self-pay | Admitting: Psychiatry

## 2019-01-15 DIAGNOSIS — I10 Essential (primary) hypertension: Secondary | ICD-10-CM | POA: Diagnosis present

## 2019-01-15 DIAGNOSIS — F200 Paranoid schizophrenia: Secondary | ICD-10-CM | POA: Diagnosis present

## 2019-01-15 DIAGNOSIS — Z79899 Other long term (current) drug therapy: Secondary | ICD-10-CM | POA: Diagnosis not present

## 2019-01-15 DIAGNOSIS — G4700 Insomnia, unspecified: Secondary | ICD-10-CM | POA: Diagnosis present

## 2019-01-15 DIAGNOSIS — Z20828 Contact with and (suspected) exposure to other viral communicable diseases: Secondary | ICD-10-CM | POA: Diagnosis present

## 2019-01-15 DIAGNOSIS — M25561 Pain in right knee: Secondary | ICD-10-CM

## 2019-01-15 LAB — HEMOGLOBIN A1C
Hgb A1c MFr Bld: 5.6 % (ref 4.8–5.6)
Mean Plasma Glucose: 114.02 mg/dL

## 2019-01-15 LAB — SARS CORONAVIRUS 2 BY RT PCR (HOSPITAL ORDER, PERFORMED IN ~~LOC~~ HOSPITAL LAB): SARS Coronavirus 2: NEGATIVE

## 2019-01-15 MED ORDER — MAGNESIUM HYDROXIDE 400 MG/5ML PO SUSP
30.0000 mL | Freq: Every day | ORAL | Status: DC | PRN
  Filled 2019-01-15: qty 30

## 2019-01-15 MED ORDER — HYDROXYZINE HCL 25 MG PO TABS
25.0000 mg | ORAL_TABLET | Freq: Four times a day (QID) | ORAL | Status: DC | PRN
  Administered 2019-01-16 – 2019-01-21 (×2): 25 mg via ORAL
  Filled 2019-01-15 (×3): qty 1

## 2019-01-15 MED ORDER — HALOPERIDOL 5 MG PO TABS
5.0000 mg | ORAL_TABLET | Freq: Two times a day (BID) | ORAL | Status: DC
  Administered 2019-01-15: 5 mg via ORAL
  Filled 2019-01-15 (×6): qty 1

## 2019-01-15 MED ORDER — BENZTROPINE MESYLATE 0.5 MG PO TABS
0.5000 mg | ORAL_TABLET | Freq: Two times a day (BID) | ORAL | Status: DC
  Administered 2019-01-15: 0.5 mg via ORAL
  Filled 2019-01-15 (×6): qty 1

## 2019-01-15 MED ORDER — IBUPROFEN 600 MG PO TABS
600.0000 mg | ORAL_TABLET | Freq: Once | ORAL | Status: AC
  Filled 2019-01-15 (×2): qty 1

## 2019-01-15 MED ORDER — ACETAMINOPHEN 325 MG PO TABS
650.0000 mg | ORAL_TABLET | Freq: Four times a day (QID) | ORAL | Status: DC | PRN
  Filled 2019-01-15: qty 2

## 2019-01-15 MED ORDER — TRAZODONE HCL 50 MG PO TABS
50.0000 mg | ORAL_TABLET | Freq: Every evening | ORAL | Status: DC | PRN
  Administered 2019-01-16 – 2019-01-18 (×3): 50 mg via ORAL
  Filled 2019-01-15 (×4): qty 1

## 2019-01-15 MED ORDER — ALUM & MAG HYDROXIDE-SIMETH 200-200-20 MG/5ML PO SUSP
30.0000 mL | ORAL | Status: DC | PRN
  Administered 2019-01-18 – 2019-01-19 (×3): 30 mL via ORAL
  Filled 2019-01-15 (×4): qty 30
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
## 2019-01-15 NOTE — Discharge Summary (Addendum)
  Patient transferred to [place] inpatient unit for psychiatric treatment  Attest to NP note
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
## 2019-01-15 NOTE — BH Assessment (Signed)
[place] Assessment Progress Note  Per Lindell Spar, NP, this pt requires psychiatric hospitalization.  Heather, RN, reports that pt will be admitted to an unspecified bed on the 500 hall later today.  Pt presents under IVC initiated by pt's mother, and upheld by Hampton Abbot, MD.  Pt's nurse, Veronique, has been notified.   Jalene Mullet, [place] Coordinator [phone]
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
## 2019-01-16 LAB — URINALYSIS, ROUTINE W REFLEX MICROSCOPIC
Bilirubin Urine: NEGATIVE
Glucose, UA: NEGATIVE mg/dL
Ketones, ur: NEGATIVE mg/dL
Leukocytes,Ua: NEGATIVE
Nitrite: NEGATIVE
Protein, ur: NEGATIVE mg/dL
Specific Gravity, Urine: 1.017 (ref 1.005–1.030)
pH: 6 (ref 5.0–8.0)

## 2019-01-16 LAB — RAPID URINE DRUG SCREEN, HOSP PERFORMED
Amphetamines: NOT DETECTED
Barbiturates: NOT DETECTED
Benzodiazepines: NOT DETECTED
Cocaine: NOT DETECTED
Opiates: NOT DETECTED
Tetrahydrocannabinol: NOT DETECTED

## 2019-01-16 LAB — CBC
HCT: 47.6 % (ref 39.0–52.0)
Hemoglobin: 14.8 g/dL (ref 13.0–17.0)
MCH: 26.5 pg (ref 26.0–34.0)
MCHC: 31.1 g/dL (ref 30.0–36.0)
MCV: 85.2 fL (ref 80.0–100.0)
Platelets: 359 10*3/uL (ref 150–400)
RBC: 5.59 MIL/uL (ref 4.22–5.81)
RDW: 16.8 % — ABNORMAL HIGH (ref 11.5–15.5)
WBC: 8 10*3/uL (ref 4.0–10.5)
nRBC: 0 % (ref 0.0–0.2)

## 2019-01-16 LAB — COMPREHENSIVE METABOLIC PANEL
ALT: 12 U/L (ref 0–44)
AST: 24 U/L (ref 15–41)
Albumin: 3.8 g/dL (ref 3.5–5.0)
Alkaline Phosphatase: 85 U/L (ref 38–126)
Anion gap: 9 (ref 5–15)
BUN: 10 mg/dL (ref 6–20)
CO2: 23 mmol/L (ref 22–32)
Calcium: 9.2 mg/dL (ref 8.9–10.3)
Chloride: 104 mmol/L (ref 98–111)
Creatinine, Ser: 1.01 mg/dL (ref 0.61–1.24)
GFR calc Af Amer: >60 mL/min (ref >60)
GFR calc non Af Amer: >60 mL/min (ref >60)
Glucose, Bld: 121 mg/dL — ABNORMAL HIGH (ref 70–99)
Potassium: 3.9 mmol/L (ref 3.5–5.1)
Sodium: 136 mmol/L (ref 135–145)
Total Bilirubin: 1.3 mg/dL — ABNORMAL HIGH (ref 0.3–1.2)
Total Protein: 8.2 g/dL — ABNORMAL HIGH (ref 6.5–8.1)

## 2019-01-16 LAB — LIPID PANEL
Cholesterol: 206 mg/dL — ABNORMAL HIGH (ref 0–200)
HDL: 36 mg/dL — ABNORMAL LOW (ref >40)
LDL Cholesterol: 158 mg/dL — ABNORMAL HIGH (ref 0–99)
Total CHOL/HDL Ratio: 5.7 RATIO
Triglycerides: 60 mg/dL (ref <150)
VLDL: 12 mg/dL (ref 0–40)

## 2019-01-16 LAB — TSH: TSH: 0.875 u[IU]/mL (ref 0.350–4.500)

## 2019-01-16 LAB — ETHANOL: Alcohol, Ethyl (B): <10 mg/dL (ref <10)

## 2019-01-16 MED ORDER — HALOPERIDOL 5 MG PO TABS
10.0000 mg | ORAL_TABLET | Freq: Two times a day (BID) | ORAL | Status: DC
  Administered 2019-01-16 – 2019-01-21 (×11): 10 mg via ORAL
  Filled 2019-01-16 (×16): qty 2

## 2019-01-16 MED ORDER — BENZTROPINE MESYLATE 1 MG PO TABS
1.0000 mg | ORAL_TABLET | Freq: Two times a day (BID) | ORAL | Status: DC
  Administered 2019-01-16 – 2019-01-21 (×10): 1 mg via ORAL
  Filled 2019-01-16 (×15): qty 1

## 2019-01-16 NOTE — Progress Notes (Signed)
Recreation Therapy Notes  INPATIENT RECREATION THERAPY ASSESSMENT  Patient Details Name: Paul Mejia MRN: [id number] DOB: May 20, 1960 Today's Date: 01/16/2019       Information Obtained From: Patient  Able to Participate in Assessment/Interview: Yes  Patient Presentation: Alert  Reason for Admission (Per Patient): Other (Comments)(Pt stated being mean to his sister.)  Patient Stressors: Other (Comment)(His household; No one helping with yardwork)  Coping Skills:   Isolation, TV, Sports, Arguments, Aggression, Exercise, Music, Talk, Prayer, Avoidance, Read, Hot Bath/Shower  Leisure Interests (2+):  Exercise - Walking, Joretta Bachelor care  Frequency of Recreation/Participation: Weekly  Awareness of Community Resources:  No  Expressed Interest in [employer] Information: No  [employer] of Residence:  [place]  Patient Main Form of Transportation: Car  Patient Strengths:  Being smart  Patient Identified Areas of Improvement:  Learn to get along with certain people; Improve attitude  Patient Goal for Hospitalization:  "to get out, have a good mind/attitude"  Current SI (including self-harm):  No  Current HI:  No  Current AVH: No  Staff Intervention Plan: Group Attendance, Collaborate with Interdisciplinary Treatment Team  Consent to Intern Participation: N/A    Victorino Sparrow, LRT/CTRS  Victorino Sparrow A 01/16/2019, 12:22 PM

## 2019-01-16 NOTE — Progress Notes (Signed)
Recreation Therapy Notes  Date: 11.12.20 Time: 1000 Location: 500 Hall Dayroom   Group Topic: Leisure Education  Goal Area(s) Addresses:  Patient will identify positive leisure activities.  Patient will identify one positive benefit of participation in leisure activities.   Behavioral Response: Engaged  Intervention: Leisure Group Game  Activity: Midwife.  Patients were spread out in the dayroom.  Patients were timed as they tossed the ball back and forth to each other to see how long they could keep the ball moving. The ball could be bounced off the floor or tossed.  At no time could the ball come to a complete stop.  If the ball stopped moving, the time would be reset.  Education:  Leisure Education, Dentist  Education Outcome: Acknowledges education/In group clarification offered/Needs additional education  Clinical Observations/Feedback: Pt was quiet but very active during the activity.  Pt seemed to be very into the activity and competitive.  After group, pt stated he enjoyed the activity.     Victorino Sparrow, LRT/CTRS    Victorino Sparrow A 01/16/2019 11:38 AM

## 2019-01-16 NOTE — BHH Suicide Risk Assessment (Signed)
[facility] Admission Suicide Risk Assessment   Nursing information obtained from:  Patient Demographic factors:  Male, Low socioeconomic status Current Mental Status:  NA Loss Factors:  Decline in physical health Historical Factors:  NA Risk Reduction Factors:  NA  Total Time spent with patient: 45 minutes Principal Problem: Schizophrenia, paranoid type (B and E) Diagnosis:  Principal Problem:   Schizophrenia, paranoid type ([place])  Subjective Data:  For exacerbation of underlying psychotic disorder Continued Clinical Symptoms:  Alcohol Use Disorder Identification Test Final Score (AUDIT): 0 The "Alcohol Use Disorders Identification Test", Guidelines for Use in Primary Care, Second Edition.  World Pharmacologist [facility]). Score between 0-7:  no or low risk or alcohol related problems. Score between 8-15:  moderate risk of alcohol related problems. Score between 16-19:  high risk of alcohol related problems. Score 20 or above:  warrants further diagnostic evaluation for alcohol dependence and treatment.   CLINICAL FACTORS:   Schizophrenia:   Paranoid or undifferentiated type   Musculoskeletal: Strength & Muscle Tone: within normal limits Gait & Station: normal Patient leans: N/A  Psychiatric Specialty Exam: Physical Exam  Nursing note and vitals reviewed. Constitutional: He appears well-developed and well-nourished.  Cardiovascular: Normal rate and regular rhythm.    Review of Systems  Constitutional: Negative.   Eyes: Negative.   Cardiovascular: Negative.   Gastrointestinal: Negative.   Genitourinary: Negative.   Neurological: Negative.   Endo/Heme/Allergies: Negative.   Negative for head trauma  Blood pressure 119/81, pulse (!) 103, temperature 98.6 F (37 C), temperature source Oral, resp. rate 18, height 5\' 7"  (1.702 m), weight 72.1 kg, SpO2 100 %.Body mass index is 24.9 kg/m.  General Appearance: Casual  Eye Contact:  Good  Speech:  Clear and Coherent  Volume:   Normal  Mood:  Anxious  Affect:  Restricted  Thought Process:  Coherent, Goal Directed and Descriptions of Associations: Circumstantial  Orientation:  Full (Time, Place, and Person)  Thought Content:  Hallucinations: Visual  Suicidal Thoughts:  No  Homicidal Thoughts:  No  Memory:  Immediate;   Fair Recent;   Fair Remote;   Fair  Judgement:  Fair  Insight:  Good  Psychomotor Activity:  Normal  Concentration:  Concentration: Good and Attention Span: Good  Recall:  Good  Fund of Knowledge:  Good  Language:  Good  Akathisia:  Negative  Handed:  Right  AIMS (if indicated):     Assets:  Communication Skills Desire for Improvement  ADL's:  Intact  Cognition: Generally intact as far as his processing of thought  Sleep:  Number of Hours: 6.25        COGNITIVE FEATURES THAT CONTRIBUTE TO RISK:  Loss of executive function    SUICIDE RISK:   Minimal: No identifiable suicidal ideation.  Patients presenting with no risk factors but with morbid ruminations; may be classified as minimal risk based on the severity of the depressive symptoms  PLAN OF CARE: Admit for stabilization  I certify that inpatient services furnished can reasonably be expected to improve the patient's condition.   Johnn Hai, MD 01/16/2019, 12:49 PM

## 2019-01-16 NOTE — Progress Notes (Signed)
Psychoeducational Group Note  Date:  01/16/2019 Time:  2039  Group Topic/Focus:  Wrap-Up Group:   The focus of this group is to help patients review their daily goal of treatment and discuss progress on daily workbooks.  Participation Level: Did Not Attend  Participation Quality:  Not Applicable  Affect:  Not Applicable  Cognitive:  Not Applicable  Insight:  Not Applicable  Engagement in Group: Not Applicable  Additional Comments:  The patient did not attend group since he slept in his room.   Archie Balboa S 01/16/2019, 8:39 PM
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
## 2019-01-16 NOTE — [facility] Group Notes (Signed)
LCSW Group Therapy Note  Date and Time: 01/16/2019 @ 1:30pm  Type of Therapy and Topic: Group Therapy: Feelings Around Returning Home & Establishing a Supportive Framework and Supporting Oneself When Supports Not Available  Participation Level: [facility] PARTICIPATION LEVEL: Did Not Attend  Mood: Did not attend     Description of Group:  Patients first processed thoughts and feelings about upcoming discharge. These included fears of upcoming changes, lack of change, new living environments, judgements and expectations from others and overall stigma of mental health issues. The group then discussed the definition of a supportive framework, what that looks and feels like, and how do to discern it from an unhealthy non-supportive network. The group identified different types of supports as well as what to do when your family/friends are less than helpful or unavailable     Therapeutic Goals   1.  Patient will identify one healthy supportive network that they can use at discharge.  2.  Patient will identify one factor of a supportive framework and how to tell it from an unhealthy network.  3.  Patient able to identify one coping skill to use when they do not have positive supports from others.  4.  Patient will demonstrate ability to communicate their needs through discussion and/or role plays.     Summary of Patient Progress:       Patient did not attend group therapy today.      Therapeutic Modalities  Cognitive Behavioral Therapy  Motivational Interviewing   Ardelle Anton, MSW, [place]

## 2019-01-17 ENCOUNTER — Encounter (HOSPITAL_COMMUNITY): Payer: Self-pay | Admitting: Registered Nurse

## 2019-01-17 DIAGNOSIS — F200 Paranoid schizophrenia: Principal | ICD-10-CM

## 2019-01-17 DIAGNOSIS — M25561 Pain in right knee: Secondary | ICD-10-CM

## 2019-01-17 LAB — PROLACTIN: Prolactin: 14.3 ng/mL (ref 4.0–15.2)

## 2019-01-17 NOTE — Tx Team (Signed)
Interdisciplinary Treatment and Diagnostic Plan Update  01/17/2019 Time of Session: 9:10am Paul Mejia MRN: [id number]  Principal Diagnosis: Schizophrenia, paranoid type ([place])  Secondary Diagnoses: Principal Problem:   Schizophrenia, paranoid type ([place])   Current Medications:  Current Facility-Administered Medications  Medication Dose Route Frequency Provider Last Rate Last Dose  . acetaminophen (TYLENOL) tablet 650 mg  650 mg Oral Q6H PRN Nwoko, Agnes I, NP      . alum & mag hydroxide-simeth (MAALOX/MYLANTA) 200-200-20 MG/5ML suspension 30 mL  30 mL Oral Q4H PRN Nwoko, Agnes I, NP      . benztropine (COGENTIN) tablet 1 mg  1 mg Oral BID Johnn Hai, MD   1 mg at 01/17/19 0919  . haloperidol (HALDOL) tablet 10 mg  10 mg Oral BID Johnn Hai, MD   10 mg at 01/17/19 6301  . hydrOXYzine (ATARAX/VISTARIL) tablet 25 mg  25 mg Oral Q6H PRN Lindell Spar I, NP   25 mg at 01/16/19 2133  . ibuprofen (ADVIL) tablet 600 mg  600 mg Oral Once Anike, Adaku C, NP      . magnesium hydroxide (MILK OF MAGNESIA) suspension 30 mL  30 mL Oral Daily PRN Nwoko, Agnes I, NP      . traZODone (DESYREL) tablet 50 mg  50 mg Oral QHS PRN Lindell Spar I, NP   50 mg at 01/16/19 2133   PTA Medications: Medications Prior to Admission  Medication Sig Dispense Refill Last Dose  . benztropine (COGENTIN) 1 MG tablet Take 1 mg by mouth 2 (two) times daily.     . haloperidol (HALDOL) 2 MG tablet Take 1 tablet (2 mg total) by mouth 2 (two) times daily. 60 tablet 60   . traZODone (DESYREL) 100 MG tablet Take 1 tablet (100 mg total) by mouth at bedtime. (Patient not taking: Reported on 04/06/2016) 30 tablet 0 Not Taking at Unknown time    Patient Stressors:    Patient Strengths:    Treatment Modalities: Medication Management, Group therapy, Case management,  1 to 1 session with clinician, Psychoeducation, Recreational therapy.   Physician Treatment Plan for Primary Diagnosis: Schizophrenia, paranoid type ([place]) Long  Term Goal(s): Improvement in symptoms so as ready for discharge Improvement in symptoms so as ready for discharge   Short Term Goals: Ability to disclose and discuss suicidal ideas Ability to identify and develop effective coping behaviors will improve Ability to maintain clinical measurements within normal limits will improve Ability to identify triggers associated with substance abuse/mental health issues will improve Ability to disclose and discuss suicidal ideas Ability to demonstrate self-control will improve Ability to identify and develop effective coping behaviors will improve Ability to identify triggers associated with substance abuse/mental health issues will improve  Medication Management: Evaluate patient's response, side effects, and tolerance of medication regimen.  Therapeutic Interventions: 1 to 1 sessions, Unit Group sessions and Medication administration.  Evaluation of Outcomes: Progressing  Physician Treatment Plan for Secondary Diagnosis: Principal Problem:   Schizophrenia, paranoid type ([place])  Long Term Goal(s): Improvement in symptoms so as ready for discharge Improvement in symptoms so as ready for discharge   Short Term Goals: Ability to disclose and discuss suicidal ideas Ability to identify and develop effective coping behaviors will improve Ability to maintain clinical measurements within normal limits will improve Ability to identify triggers associated with substance abuse/mental health issues will improve Ability to disclose and discuss suicidal ideas Ability to demonstrate self-control will improve Ability to identify and develop effective coping behaviors will improve Ability to  identify triggers associated with substance abuse/mental health issues will improve     Medication Management: Evaluate patient's response, side effects, and tolerance of medication regimen.  Therapeutic Interventions: 1 to 1 sessions, Unit Group sessions and Medication  administration.  Evaluation of Outcomes: Progressing   RN Treatment Plan for Primary Diagnosis: Schizophrenia, paranoid type ([facility]) Long Term Goal(s): Knowledge of disease and therapeutic regimen to maintain health will improve  Short Term Goals: Ability to participate in decision making will improve, Ability to verbalize feelings will improve, Ability to disclose and discuss suicidal ideas, Ability to identify and develop effective coping behaviors will improve and Compliance with prescribed medications will improve  Medication Management: RN will administer medications as ordered by provider, will assess and evaluate patient's response and provide education to patient for prescribed medication. RN will report any adverse and/or side effects to prescribing provider.  Therapeutic Interventions: 1 on 1 counseling sessions, Psychoeducation, Medication administration, Evaluate responses to treatment, Monitor vital signs and CBGs as ordered, Perform/monitor CIWA, COWS, AIMS and Fall Risk screenings as ordered, Perform wound care treatments as ordered.  Evaluation of Outcomes: Progressing   LCSW Treatment Plan for Primary Diagnosis: Schizophrenia, paranoid type ([facility]) Long Term Goal(s): Safe transition to appropriate next level of care at discharge, Engage patient in therapeutic group addressing interpersonal concerns.  Short Term Goals: Engage patient in aftercare planning with referrals and resources and Increase skills for wellness and recovery  Therapeutic Interventions: Assess for all discharge needs, 1 to 1 time with Social worker, Explore available resources and support systems, Assess for adequacy in community support network, Educate family and significant other(s) on suicide prevention, Complete Psychosocial Assessment, Interpersonal group therapy.  Evaluation of Outcomes: Progressing   Progress in Treatment: Attending groups: No. Participating in groups: No. Taking medication as  prescribed: Yes. Toleration medication: Yes. Family/Significant other contact made: No, will contact:  pt's mother Patient understands diagnosis: Yes. Discussing patient identified problems/goals with staff: Yes. Medical problems stabilized or resolved: Yes. Denies suicidal/homicidal ideation: Yes. Issues/concerns per patient self-inventory: No. Other:   New problem(s) identified: No, Describe:  None  New Short Term/Long Term Goal(s): Medication stabilization, elimination of SI thoughts, and development of a comprehensive mental wellness plan.   Patient Goals:  Patient did not state a goal.  Discharge Plan or Barriers: Patient will be going home after discharge and will be following up with Dr. Merlyn Albert at [place].   Reason for Continuation of Hospitalization: Hallucinations Medication stabilization  Estimated Length of Stay: 2-3 days   Attendees: Patient: 01/17/2019   Physician: Dr. Nehemiah Massed, MD 01/17/2019  Nursing: Marton Redwood, RN 01/17/2019  RN Care Manager: 01/17/2019   Social Worker: Stephannie Peters, LCSW 01/17/2019   Recreational Therapist:  01/17/2019   Other:  01/17/2019   Other:  01/17/2019   Other: 01/17/2019      Scribe for Treatment Team: Delphia Grates, LCSW 01/17/2019 10:01 AM

## 2019-01-17 NOTE — [facility] Suicide Risk Assessment (Cosign Needed)
[place] INPATIENT:  Family/Significant Other Suicide Prevention Education  Suicide Prevention Education:  Education Completed; with mother, Jinny Sanders, [phone] has been identified by the patient as the family member/significant other with whom the patient will be residing, and identified as the person(s) who will aid the patient in the event of a mental health crisis (suicidal ideations/suicide attempt).  With written consent from the patient, the family member/significant other has been provided the following suicide prevention education, prior to the and/or following the discharge of the patient.  The suicide prevention education provided includes the following:  Suicide risk factors  Suicide prevention and interventions  National Suicide Hotline telephone number  [facility] assessment telephone number  [facility] Emergency Assistance [place] and/or Residential Mobile Crisis Unit telephone number  Request made of family/significant other to:  Remove weapons (e.g., guns, rifles, knives), all items previously/currently identified as safety concern.    Remove drugs/medications (over-the-counter, prescriptions, illicit drugs), all items previously/currently identified as a safety concern.  The family member/significant other verbalizes understanding of the suicide prevention education information provided.  The family member/significant other agrees to remove the items of safety concern listed above.  Pt's mother stated she would like to speak to the doctor. Pt's mother stated there is a gun but it is in the safe. Pt's mother did not have any questions or concerns for the social worker.   Billey Chang 01/17/2019, 1:17 PM
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
## 2019-01-17 NOTE — Progress Notes (Signed)
Recreation Therapy Notes  Date: 11.13.20 Time: 1000 Location: 500 Hall Dayroom  Group Topic: Self-Esteem  Goal Area(s) Addresses:  Patient will successfully identify positive attributes about themselves.  Patient will successfully identify benefit of improved self-esteem.   Behavioral Response: Engaged  Intervention: Blank crest  Activity: Crest of Arms.  Patients were given a crest divided into four spaces.  Patients were to fill each space with positive things about themselves.  Education:  Self-Esteem, Dentist.   Education Outcome: Acknowledges education/In group clarification offered/Needs additional education  Clinical Observations/Feedback: Pt described himself as a caring person, happy doing good and holds [employer] records.  Pt explained he holds five records in middle school and 5 in high school and they have not been broken.  Pt stated he is very proud of those records.   Victorino Sparrow, LRT/CTRS         Victorino Sparrow A 01/17/2019 11:20 AM

## 2019-01-17 NOTE — Progress Notes (Signed)
Spirituality group facilitated by Simone Curia, MDiv, BCC.  Group Description:  Group focused on topic of hope.  Patients participated in facilitated discussion around topic, connecting with one another around experiences and definitions for hope.  Group members engaged with visual explorer photos, reflecting on what hope looks like for them today.  Group engaged in discussion around how their definitions of hope are present today in hospital.   Modalities: Psycho-social ed, Adlerian, Narrative, MI Patient Progress: Zollie was present throughout group.  Engaged in group conversation, he notes that he finds hope in prayer and attending church.  Mihailo states he wants to get back to attending church and talked with other group members about the importance of supportive relationships.

## 2019-01-17 NOTE — Progress Notes (Signed)
Adult Psychoeducational Group Note  Date:  01/17/2019 Time:  9:22 PM  Group Topic/Focus:  Wrap-Up Group:   The focus of this group is to help patients review their daily goal of treatment and discuss progress on daily workbooks.  Participation Level:  Minimal  Participation Quality:  Appropriate  Affect:  Appropriate  Cognitive:  Oriented  Insight: Appropriate  Engagement in Group:  Engaged  Modes of Intervention:  Education and Support  Additional Comments:  Patient attended and participated in group tonight. He reports that his goal for today is to do well so he could go home.   Salley Scarlet [facility] 01/17/2019, 9:22 PM
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
## 2019-01-17 NOTE — Progress Notes (Signed)
Pt has been isolative, stayed in the room most of the shift, only came out for meds and snacks. Pt complained of right knee pain, denies SI/HI and contracted for safety. Pt remain safe on the unit, will continue to monitor.

## 2019-01-17 NOTE — [facility] Group Notes (Signed)
[facility] LCSW Group Therapy Note  Date/Time: 11/13 at 11:30  Type of Therapy/Topic:  Group Therapy:  Feelings about Diagnosis  Participation Level:  Did Not Attend   Mood: Pt did not attend    Description of Group:    This group will allow patients to explore their thoughts and feelings about diagnoses they have received. Patients will be guided to explore their level of understanding and acceptance of these diagnoses. Facilitator will encourage patients to process their thoughts and feelings about the reactions of others to their diagnosis, and will guide patients in identifying ways to discuss their diagnosis with significant others in their lives. This group will be process-oriented, with patients participating in exploration of their own experiences as well as giving and receiving support and challenge from other group members.   Therapeutic Goals: 1. Patient will demonstrate understanding of diagnosis as evidence by identifying two or more symptoms of the disorder:  2. Patient will be able to express two feelings regarding the diagnosis 3. Patient will demonstrate ability to communicate their needs through discussion and/or role plays  Summary of Patient Progress:  Pt did not attend group      Therapeutic Modalities:   Cognitive Behavioral Therapy Brief Therapy Feelings Identification   Latham Kinzler, MSW intern   

## 2019-01-17 NOTE — Progress Notes (Signed)
   01/17/19 2100  Psych Admission Type (Psych Patients Only)  Admission Status Voluntary  Psychosocial Assessment  Patient Complaints None  Eye Contact Poor  Facial Expression Anxious;Sad  Affect Anxious;Sad  Speech Logical/coherent  Interaction Minimal;No initiation  Motor Activity Slow  Appearance/Hygiene Unremarkable  Behavior Characteristics Cooperative;Appropriate to situation  Mood Pleasant  Thought Process  Coherency WDL  Content WDL  Delusions None reported or observed  Perception WDL  Hallucination None reported or observed  Judgment Poor  Confusion None  Danger to Self  Current suicidal ideation? Denies  Danger to Others  Danger to Others None reported or observed

## 2019-01-18 MED ORDER — IBUPROFEN 600 MG PO TABS
ORAL_TABLET | ORAL | Status: AC
  Administered 2019-01-18: 10:00:00
  Filled 2019-01-18: qty 1

## 2019-01-18 MED ORDER — IBUPROFEN 800 MG PO TABS
800.0000 mg | ORAL_TABLET | Freq: Once | ORAL | Status: AC
  Administered 2019-01-18: 800 mg via ORAL
  Filled 2019-01-18 (×2): qty 1

## 2019-01-18 MED ORDER — IBUPROFEN 400 MG PO TABS: 400.0000 mg | ORAL_TABLET | Freq: Four times a day (QID) | ORAL | Status: DC | PRN

## 2019-01-18 NOTE — [facility] Group Notes (Signed)
Adult Psychoeducational Group Note  Date:  01/18/2019 Time:  10:42 PM  Group Topic/Focus:  Wrap-Up Group:   The focus of this group is to help patients review their daily goal of treatment and discuss progress on daily workbooks.  Participation Level:  Active  Participation Quality:  Appropriate and Attentive  Affect:  Appropriate  Cognitive:  Alert and Appropriate  Insight: Appropriate and Good  Engagement in Group:  Engaged  Modes of Intervention:  Discussion and Education  Additional Comments:  Pt attended and participated in wrap up group this evening and rated their day a 7/10. Pt completed their goal, which was to work on a discharge plan, which is still in progress. Pt has a possible discharge date for Monday.   Cristi Loron 01/18/2019, 10:42 PM

## 2019-01-18 NOTE — Progress Notes (Addendum)
[facility]  01/18/2019 10:37 AM Lonna CobbDwayne Schreifels  MRN:  [id number] Subjective:  "I'm in the best mood I've been in here."  Mr. Paul Mejia found participating in group therapy. He presents with euthymic affect and reports stable mood. He denies AVH and shows no signs of responding to internal stimuli. He presents with delusional thought content. Per prior chart notes, family members had seen patient fighting imaginary people in the yard. He admits that he had been seeing "men in dark coats" in the front yard and that he did not know where they came from. He admits to becoming agitated at home related to seeing the men in the yard. He states "I need to talk to the [employer] and the [employer] to figure out where they came from." He also reports that he had been feeling someone sticking needles in the back of his head at home and that someone had been stealing things from his room at home. He denies tactile hallucinations or visual hallucinations of men during hospitalization. He denies concerns that anyone is stealing from him in the hospital. He is not receptive to idea that he was hallucinating at home and states, "I don't know why I'm in the hospital. I need to call the [employer] Service." He reports med compliance with Haldol 2 mg BID at home and has been increased to Haldol 10 mg BID since admission. He is med compliant. He is calm and cooperative. Denies SI/HI. Denies alcohol/drug use, with both admission UDS and BAL negative.  From admission H&P: This is a repeat admission at our facility for Mr. Paul Mejia, however the first since July 2017, with 1 intervening ER evaluation in 2018.  He presented yesterday, on 11/11, under petition for involuntary commitment.  Family members were concerned about an exacerbation in his underlying schizophrenic condition, manifest by hallucinations, seemingly fighting "imaginary people" in the yard and threatening family members.  Principal Problem: Schizophrenia,  paranoid type (HCC) Diagnosis: Principal Problem:   Schizophrenia, paranoid type (HCC) Active Problems:   Acute pain of right knee  Total Time spent with patient: 15 minutes  Past Psychiatric History: See admission H&P  Past Medical History:  Past Medical History:  Diagnosis Date  . Hypertension   . Schizophrenia [facility](HCC)     Past Surgical History:  Procedure Laterality Date  . APPENDECTOMY     Family History:  Family History  Problem Relation Age of Onset  . Mental illness Neg Hx    Family Psychiatric  History: See admission H&P Social History:  Social History   Substance and Sexual Activity  Alcohol Use No     Social History   Substance and Sexual Activity  Drug Use No    Social History   Socioeconomic History  . Marital status: Single    Spouse name: Not on file  . Number of children: Not on file  . Years of education: Not on file  . Highest education level: Not on file  Occupational History  . Not on file  Social Needs  . Financial resource strain: Not on file  . Food insecurity    Worry: Not on file    Inability: Not on file  . Transportation needs    Medical: Not on file    Non-medical: Not on file  Tobacco Use  . Smoking status: Unknown If Ever Smoked  . Smokeless tobacco: Never Used  Substance and Sexual Activity  . Alcohol use: No  . Drug use: No  . Sexual activity:  Not on file  Lifestyle  . Physical activity    Days per week: Not on file    Minutes per session: Not on file  . Stress: Not on file  Relationships  . Social Herbalist on phone: Not on file    Gets together: Not on file    Attends religious service: Not on file    Active member of club or organization: Not on file    Attends meetings of clubs or organizations: Not on file    Relationship status: Not on file  Other Topics Concern  . Not on file  Social History Narrative  . Not on file   Additional Social History:    Pain Medications: please see  mar Prescriptions: please see mar Over the Counter: please see mar History of alcohol / drug use?: No history of alcohol / drug abuse Longest period of sobriety (when/how long): NA                    Sleep: Good  Appetite:  Good  Current Medications: Current Facility-Administered Medications  Medication Dose Route Frequency Provider Last Rate Last Dose  . acetaminophen (TYLENOL) tablet 650 mg  650 mg Oral Q6H PRN Nwoko, Agnes I, NP      . alum & mag hydroxide-simeth (MAALOX/MYLANTA) 200-200-20 MG/5ML suspension 30 mL  30 mL Oral Q4H PRN Nwoko, Agnes I, NP      . benztropine (COGENTIN) tablet 1 mg  1 mg Oral BID Johnn Hai, MD   1 mg at 01/18/19 0943  . haloperidol (HALDOL) tablet 10 mg  10 mg Oral BID Johnn Hai, MD   10 mg at 01/18/19 0943  . hydrOXYzine (ATARAX/VISTARIL) tablet 25 mg  25 mg Oral Q6H PRN Lindell Spar I, NP   25 mg at 01/16/19 2133  . magnesium hydroxide (MILK OF MAGNESIA) suspension 30 mL  30 mL Oral Daily PRN Lindell Spar I, NP      . traZODone (DESYREL) tablet 50 mg  50 mg Oral QHS PRN Lindell Spar I, NP   50 mg at 01/17/19 2102    Lab Results: No results found for this or any previous visit (from the past 48 hour(s)).  Blood Alcohol level:  Lab Results  Component Value Date   ETH <10 01/16/2019   ETH <5 08/65/7846    Metabolic Disorder Labs: Lab Results  Component Value Date   HGBA1C 5.6 01/15/2019   MPG 114.02 01/15/2019   MPG 117 09/10/2015   Lab Results  Component Value Date   PROLACTIN 14.3 01/16/2019   PROLACTIN 20.6 (H) 09/10/2015   Lab Results  Component Value Date   CHOL 206 (H) 01/16/2019   TRIG 60 01/16/2019   HDL 36 (L) 01/16/2019   CHOLHDL 5.7 01/16/2019   VLDL 12 01/16/2019   LDLCALC 158 (H) 01/16/2019   LDLCALC 142 (H) 09/10/2015    Physical Findings: AIMS:  , ,  ,  ,    CIWA:    COWS:     Musculoskeletal: Strength & Muscle Tone: within normal limits Gait & Station: normal Patient leans: N/A  Psychiatric  Specialty Exam: Physical Exam  Nursing note and vitals reviewed. Constitutional: He is oriented to person, place, and time. He appears well-developed and well-nourished.  Cardiovascular: Normal rate.  Respiratory: Effort normal.  Neurological: He is alert and oriented to person, place, and time.    Review of Systems  Constitutional: Negative.   Respiratory: Negative for cough and shortness  of breath.   Cardiovascular: Negative for chest pain.  Psychiatric/Behavioral: Negative for depression, hallucinations, substance abuse and suicidal ideas. The patient is not nervous/anxious and does not have insomnia.     Blood pressure 115/73, pulse (!) 111, temperature 97.7 F (36.5 C), temperature source Oral, resp. rate 16, height 5\' 7"  (1.702 m), weight 72.1 kg, SpO2 100 %.Body mass index is 24.9 kg/m.  General Appearance: Casual  Eye Contact:  Good  Speech:  Normal Rate  Volume:  Normal  Mood:  Euthymic  Affect:  Congruent  Thought Process:  Coherent  Orientation:  Full (Time, Place, and Person)  Thought Content:  Delusions and Paranoid Ideation  Suicidal Thoughts:  No  Homicidal Thoughts:  No  Memory:  Immediate;   Fair Recent;   Fair  Judgement:  Fair  Insight:  Lacking  Psychomotor Activity:  Normal  Concentration:  Concentration: Fair and Attention Span: Fair  Recall:  of Knowledge:  Fair  Language:  Good  Akathisia:  No  Handed:  Right  AIMS (if indicated):     Assets:  Communication Skills Desire for Improvement Housing Resilience Social Support  ADL's:  Intact  Cognition:  WNL  Sleep:  Number of Hours: 4.5     Treatment Plan Summary: Daily contact with patient to assess and evaluate symptoms and progress in treatment and Medication management   Continue inpatient hospitalization.  Continue Haldol 10 mg PO BID for psychosis Continue Cogentin 1 mg PO BID for EPS Continue Vistaril 25 mg PO Q6HR PRN anxiety Continue trazodone 50 mg PO QHS PRN  insomnia  Patient will participate in the therapeutic group milieu.  Discharge disposition in progress.   [employer], NP 01/18/2019, 10:37 AM   Attest to NP note

## 2019-01-18 NOTE — Progress Notes (Signed)
Patient ID: Liston [place]side, male   DOB: 06/10/1960, 58 y.o.   MRN: [id number]   [place] NOVEL CORONAVIRUS (COVID-19) DAILY CHECK-OFF SYMPTOMS - answer yes or no to each - every day NO YES  Have you had a fever in the past 24 hours?  . Fever (Temp > 37.80C / 100F) X   Have you had any of these symptoms in the past 24 hours? . New Cough .  Sore Throat  .  Shortness of Breath .  Difficulty Breathing .  Unexplained Body Aches   X   Have you had any one of these symptoms in the past 24 hours not related to allergies?   . Runny Nose .  Nasal Congestion .  Sneezing   X   If you have had runny nose, nasal congestion, sneezing in the past 24 hours, has it worsened?  X   EXPOSURES - check yes or no X   Have you traveled outside the state in the past 14 days?  X   Have you been in contact with someone with a confirmed diagnosis of COVID-19 or PUI in the past 14 days without wearing appropriate PPE?  X   Have you been living in the same home as a person with confirmed diagnosis of COVID-19 or a PUI (household contact)?    X   Have you been diagnosed with COVID-19?    X              What to do next: Answered NO to all: Answered YES to anything:   Proceed with unit schedule Follow the [facility] Inpatient Flowsheet.   

## 2019-01-18 NOTE — [facility] Group Notes (Signed)
LCSW Group Therapy Note  Date/Time:  01/18/2019   11:00AM  Type of Therapy and Topic:  Group Therapy:  Fears and Unhealthy/Healthy Coping Skills  Participation Level:  Active   Description of Group:  The focus of this group was to discuss some of the prevalent fears that patients experience, and to identify the commonalities among group members.  A fun exercise was used to initiate the discussion, followed by writing on the white board a group-generated list of unhealthy coping and healthy coping techniques to deal with each fear.    Therapeutic Goals: 1. Patient will be able to distinguish between healthy and unhealthy coping skills 2. Patient will identify and describe 3 fears they experience 3. Patient will identify one positive coping strategy for each fear they experience 4. Patient will respond empathetically to peers' statements regarding fears they experience  Summary of Patient Progress:  The patient expressed little during group initially, but then he eventually became responsive and talked about using faith as a healthy coping technique.  Therapeutic Modalities Cognitive Behavioral Therapy Motivational Interviewing  Selmer Dominion, LCSW

## 2019-01-18 NOTE — Plan of Care (Signed)
  Problem: Coping: Goal: Ability to verbalize frustrations and anger appropriately will improve Outcome: Progressing Goal: Ability to demonstrate self-control will improve Outcome: Progressing   Problem: Safety: Goal: Periods of time without injury will increase Outcome: Progressing   

## 2019-01-19 MED ORDER — TRAZODONE HCL 100 MG PO TABS
100.0000 mg | ORAL_TABLET | Freq: Every evening | ORAL | Status: DC | PRN
  Administered 2019-01-19: 100 mg via ORAL
  Filled 2019-01-19: qty 1

## 2019-01-19 NOTE — Progress Notes (Signed)
[facility] Group Notes:  (Nursing/MHT/Case Management/Adjunct)  Date:  01/19/2019  Time:  1000  Type of Therapy:  Nurse Education  Participation Level:  Active  Participation Quality:  Appropriate  Affect:  Appropriate  Cognitive:  Insightful   Insight:  Improving  Engagement in Group:  Engaged  Modes of Intervention:  Activity  Summary of Progress/Problems: Exercise and therapeutic ball activity

## 2019-01-19 NOTE — Progress Notes (Addendum)
[facility] MD Progress Note  01/19/2019 9:38 AM Paul Mejia  MRN:  [id number] Subjective:  "I'm doing well."  Paul Mejia found resting in his room. He is calm and pleasant and reports good mood. He reports difficulty falling asleep last night with 50 mg trazodone. He was sitting in his room and tried lying down but was unable to fall asleep. 1.75 hours of sleep recorded. He states insomnia has been a problem for him at home as well. 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 He denies AVH in the hospital. He continues to express concern about men he saw in the yard at home but is open to considering this may have been a hallucination. He has been speaking with his mother on the phone and reports that she will let him come home at discharge. He denies SI/HI. No agitated or disruptive behaviors on the unit.  From admission H&P: This is a repeat admission at our facility for Paul Mejia, however the first since July 2017,with 1 intervening ER evaluation in 2018. He presented yesterday, on 11/11, under petition for involuntary commitment. Family members were concerned about an exacerbation in his underlying schizophrenic condition, manifest by hallucinations, seemingly fighting "imaginary people" in the yard and threatening family members.  Principal Problem: Schizophrenia, paranoid type (HCC) Diagnosis: Principal Problem:   Schizophrenia, paranoid type (HCC) Active Problems:   Acute pain of right knee  Total Time spent with patient: 15 minutes  Past Psychiatric History: See admission H&P  Past Medical History:  Past Medical History:  Diagnosis Date  . Hypertension   . Schizophrenia [facility])     Past Surgical History:  Procedure Laterality Date  . APPENDECTOMY     Family History:  Family History  Problem Relation Age of Onset   . Mental illness Neg Hx    Family Psychiatric  History: See admission H&P Social History:  Social History   Substance and Sexual Activity  Alcohol Use No     Social History   Substance and Sexual Activity  Drug Use No    Social History   Socioeconomic History  . Marital status: Single    Spouse name: Not on file  . Number of children: Not on file  . Years of education: Not on file  . Highest education level: Not on file  Occupational History  . Not on file  Social Needs  . Financial resource strain: Not on file  . Food insecurity    Worry: Not on file    Inability: Not on file  . Transportation needs    Medical: Not on file    Non-medical: Not on file  Tobacco Use  . Smoking status: Unknown If Ever Smoked  . Smokeless tobacco: Never Used  Substance and Sexual Activity  . Alcohol use: No  . Drug use: No  . Sexual activity: Not on file  Lifestyle  . Physical activity    Days per week: Not on file    Minutes per session: Not on file  . Stress: Not on file  Relationships  . Social Musician on phone: Not on file    Gets together: Not on file    Attends religious service: Not on file    Active member of club or organization: Not on file    Attends meetings of  clubs or organizations: Not on file    Relationship status: Not on file  Other Topics Concern  . Not on file  Social History Narrative  . Not on file   Additional Social History:    Pain Medications: please see mar Prescriptions: please see mar Over the Counter: please see mar History of alcohol / drug use?: No history of alcohol / drug abuse Longest period of sobriety (when/how long): NA                    Sleep: Good  Appetite:  Good  Current Medications: Current Facility-Administered Medications  Medication Dose Route Frequency Provider Last Rate Last Dose  . acetaminophen (TYLENOL) tablet 650 mg  650 mg Oral Q6H PRN Armandina StammerNwoko, Agnes I, NP      . alum & mag hydroxide-simeth  (MAALOX/MYLANTA) 200-200-20 MG/5ML suspension 30 mL  30 mL Oral Q4H PRN Armandina StammerNwoko, Agnes I, NP   30 mL at 01/18/19 2028  . benztropine (COGENTIN) tablet 1 mg  1 mg Oral BID Malvin JohnsFarah, Brian, MD   1 mg at 01/19/19 0853  . haloperidol (HALDOL) tablet 10 mg  10 mg Oral BID Malvin JohnsFarah, Brian, MD   10 mg at 01/19/19 0853  . hydrOXYzine (ATARAX/VISTARIL) tablet 25 mg  25 mg Oral Q6H PRN Armandina StammerNwoko, Agnes I, NP   25 mg at 01/16/19 2133  . magnesium hydroxide (MILK OF MAGNESIA) suspension 30 mL  30 mL Oral Daily PRN Armandina StammerNwoko, Agnes I, NP      . traZODone (DESYREL) tablet 50 mg  50 mg Oral QHS PRN Armandina StammerNwoko, Agnes I, NP   50 mg at 01/18/19 2144    Lab Results: No results found for this or any previous visit (from the past 48 hour(s)).  Blood Alcohol level:  Lab Results  Component Value Date   ETH <10 01/16/2019   ETH <5 04/06/2016    Metabolic Disorder Labs: Lab Results  Component Value Date   HGBA1C 5.6 01/15/2019   MPG 114.02 01/15/2019   MPG 117 09/10/2015   Lab Results  Component Value Date   PROLACTIN 14.3 01/16/2019   PROLACTIN 20.6 (H) 09/10/2015   Lab Results  Component Value Date   CHOL 206 (H) 01/16/2019   TRIG 60 01/16/2019   HDL 36 (L) 01/16/2019   CHOLHDL 5.7 01/16/2019   VLDL 12 01/16/2019   LDLCALC 158 (H) 01/16/2019   LDLCALC 142 (H) 09/10/2015    Physical Findings: AIMS:  , ,  ,  ,    CIWA:    COWS:     Musculoskeletal: Strength & Muscle Tone: within normal limits Gait & Station: normal Patient leans: N/A  Psychiatric Specialty Exam: Physical Exam  Nursing note and vitals reviewed. Constitutional: He is oriented to person, place, and time. He appears well-developed and well-nourished.  Cardiovascular: Normal rate.  Respiratory: Effort normal.  Neurological: He is alert and oriented to person, place, and time.    Review of Systems  Constitutional: Negative.   Respiratory: Negative for cough and shortness of breath.   Cardiovascular: Negative for chest pain.   Psychiatric/Behavioral: Negative for depression, hallucinations, substance abuse and suicidal ideas. The patient is not nervous/anxious and does not have insomnia.     Blood pressure 107/66, pulse 92, temperature 98.2 F (36.8 C), resp. rate 16, height 5\' 7"  (1.702 m), weight 72.1 kg, SpO2 100 %.Body mass index is 24.9 kg/m.  General Appearance: Casual  Eye Contact:  Good  Speech:  Normal Rate  Volume:  Normal  Mood:  Euthymic  Affect:  Appropriate and Congruent  Thought Process:  Coherent  Orientation:  Full (Time, Place, and Person)  Thought Content:  Delusions  Suicidal Thoughts:  No  Homicidal Thoughts:  No  Memory:  Immediate;   Good Recent;   Good  Judgement:  Fair  Insight:  Lacking  Psychomotor Activity:  Normal  Concentration:  Concentration: Good and Attention Span: Good  Recall:  Good  Fund of Knowledge:  Good  Language:  Good  Akathisia:  No  Handed:  Right  AIMS (if indicated):     Assets:  Communication Skills Desire for Improvement Housing Resilience Social Support  ADL's:  Intact  Cognition:  WNL  Sleep:  Number of Hours: 1.75     Treatment Plan Summary: Daily contact with patient to assess and evaluate symptoms and progress in treatment and Medication management   Continue inpatient hospitalization.  Increase trazodone to 100 mg PO QHS PRN insomnia Continue Haldol 10 mg PO BID for psychosis Continue Cogentin 1 mg PO BID for EPS Continue Vistaril 25 mg PO Q6HR PRN anxiety  Patient will participate in the therapeutic group milieu.  Discharge disposition in progress.   Connye Burkitt, NP 01/19/2019, 9:38 AM   Attest to NP note

## 2019-01-19 NOTE — Progress Notes (Signed)
   01/18/19 2145  Psych Admission Type (Psych Patients Only)  Admission Status Voluntary  Psychosocial Assessment  Patient Complaints None  Eye Contact Brief  Facial Expression Anxious  Affect Anxious  Speech Logical/coherent  Interaction Minimal  Motor Activity Other (Comment) (WNL)  Appearance/Hygiene Unremarkable  Behavior Characteristics Appropriate to situation  Mood Pleasant  Thought Process  Coherency WDL  Content WDL  Delusions None reported or observed  Perception WDL  Hallucination None reported or observed  Judgment Poor  Confusion None  Danger to Self  Current suicidal ideation? Denies  Danger to Others  Danger to Others None reported or observed

## 2019-01-19 NOTE — Progress Notes (Signed)
DAR NOTE: Patient presents with flat affect and depressed mood.  Denies suicidal thoughts, pain, auditory and visual hallucinations.  Reports poor night sleep and roommate standing over his bed.  Described energy level as normal and concentration as good.  Rates depression at 0, hopelessness at 0, and anxiety at 0.  Maintained on routine safety checks.  Medications given as prescribed.  Support and encouragement offered as needed.  Attended group and participated.  States goal for today is "going home."  Patient visible in milieu with minimal interaction.  Patient is safe on and off the unit.  Offered no complaint.

## 2019-01-19 NOTE — [facility] Group Notes (Signed)
[facility] LCSW Group Therapy Note  Date/Time:  01/19/2019  11:00AM-12:00PM  Type of Therapy and Topic:  Group Therapy:  Music and Mood  Participation Level:  Active   Description of Group: In this process group, members listened to a variety of genres of music and identified that different types of music evoke different responses.  Patients were encouraged to identify music that was soothing for them and music that was energizing for them.  Patients discussed how this knowledge can help with wellness and recovery in various ways including managing depression and anxiety as well as encouraging healthy sleep habits.    Therapeutic Goals: 1. Patients will explore the impact of different varieties of music on mood 2. Patients will verbalize the thoughts they have when listening to different types of music 3. Patients will identify music that is soothing to them as well as music that is energizing to them 4. Patients will discuss how to use this knowledge to assist in maintaining wellness and recovery 5. Patients will explore the use of music as a coping skill  Summary of Patient Progress:  At the beginning of group, patient expressed that he felt good.  At the end of group, patient expressed that he felt relaxed.  He expressed enjoyment for much of the music and stayed for the entire group.    Therapeutic Modalities: Solution Focused Brief Therapy Activity   Selmer Dominion, LCSW

## 2019-01-20 MED ORDER — TRAZODONE HCL 100 MG PO TABS
100.0000 mg | ORAL_TABLET | Freq: Every evening | ORAL | Status: DC | PRN
  Administered 2019-01-20: 100 mg via ORAL
  Filled 2019-01-20: qty 1

## 2019-01-20 NOTE — [facility] Group Notes (Signed)
Adult Psychoeducational Group Note  Date:  01/20/2019 Time:  11:40 PM  Group Topic/Focus:  Wrap-Up Group:   The focus of this group is to help patients review their daily goal of treatment and discuss progress on daily workbooks.  Participation Level:  Active  Participation Quality:  Appropriate  Affect:  Appropriate  Cognitive:  Appropriate  Insight: Appropriate  Engagement in Group:  Engaged  Modes of Intervention:  Discussion  Additional Comments:  Pt stated his goal was to do better with medication management and go home.  Pt stated this is an on-going goal.  Pt rated the day at a 5/10.   Chayna Surratt 01/20/2019, 11:40 PM

## 2019-01-20 NOTE — Progress Notes (Signed)
Patient ID: Paul Mejia, male   DOB: [birth year], 58 y.o.   MRN: [id number]   [place] NOVEL CORONAVIRUS (COVID-19) DAILY CHECK-OFF SYMPTOMS - answer yes or no to each - every day NO YES  Have you had a fever in the past 24 hours?  . Fever (Temp > 37.80C / 100F) X   Have you had any of these symptoms in the past 24 hours? . New Cough .  Sore Throat  .  Shortness of Breath .  Difficulty Breathing .  Unexplained Body Aches   X   Have you had any one of these symptoms in the past 24 hours not related to allergies?   . Runny Nose .  Nasal Congestion .  Sneezing   X   If you have had runny nose, nasal congestion, sneezing in the past 24 hours, has it worsened?  X   EXPOSURES - check yes or no X   Have you traveled outside the state in the past 14 days?  X   Have you been in contact with someone with a confirmed diagnosis of COVID-19 or PUI in the past 14 days without wearing appropriate PPE?  X   Have you been living in the same home as a person with confirmed diagnosis of COVID-19 or a PUI (household contact)?    X   Have you been diagnosed with COVID-19?    X              What to do next: Answered NO to all: Answered YES to anything:   Proceed with unit schedule Follow the [facility] Inpatient Flowsheet.

## 2019-01-20 NOTE — Progress Notes (Signed)
D.  Pt pleasant on approach, no complaints voiced.  Pt pleased that Trazodone dose has been increased for tonight, states didn't sleep very well last night.  Pt was positive for evening wrap up group, observed appropriately engaged with peers on the unit.  Pt denies SI/HI/AVH at this time and is hopeful to go home soon.  A.  Support and encouragement offered, medication given as ordered  R. Pt remains safe on the unit, will continue to monitor.

## 2019-01-20 NOTE — Progress Notes (Signed)
Recreation Therapy Notes  Date:  11.16.20 Time: 0930 Location: [address] Dayroom  Group Topic: Stress Management  Goal Area(s) Addresses:  Patient will identify positive stress management techniques. Patient will identify benefits of using stress management post d/c.  Behavioral Response: Engaged  Intervention: Stress Management  Activity :  [facility].  LRT read a script that focused on taking a walk through a relaxing forest.  Patients were to listen and follow along as script was read to engage in activity.  Education:  Stress Management, Discharge Planning.   Education Outcome: Acknowledges Education  Clinical Observations/Feedback: Pt attended and participated in activity.    Victorino Sparrow, LRT/CTRS         Ria Comment, Tommy Goostree A 01/20/2019 11:09 AM

## 2019-01-20 NOTE — Plan of Care (Signed)
  Problem: Education: Goal: Mental status will improve Outcome: Progressing   Problem: Coping: Goal: Ability to verbalize frustrations and anger appropriately will improve Outcome: Progressing Goal: Ability to demonstrate self-control will improve Outcome: Progressing   Problem: Safety: Goal: Periods of time without injury will increase Outcome: Progressing   Problem: Coping: Goal: Coping ability will improve Outcome: Progressing   Problem: Medication: Goal: Compliance with prescribed medication regimen will improve Outcome: Progressing

## 2019-01-20 NOTE — [facility] Group Notes (Signed)
LCSW Group Therapy Notes 01/20/2019 1:49 PM  Type of Therapy and Topic: Group Therapy: Overcoming Obstacles  Participation Level: Active  Description of Group:  In this group patients will be encouraged to explore what they see as obstacles to their own wellness and recovery. They will be guided to discuss their thoughts, feelings, and behaviors related to these obstacles. The group will process together ways to cope with barriers, with attention given to specific choices patients can make. Each patient will be challenged to identify changes they are motivated to make in order to overcome their obstacles. This group will be process-oriented, with patients participating in exploration of their own experiences as well as giving and receiving support and challenge from other group members.  Therapeutic Goals: 1. Patient will identify personal and current obstacles as they relate to admission. 2. Patient will identify barriers that currently interfere with their wellness or overcoming obstacles.  3. Patient will identify feelings, thought process and behaviors related to these barriers. 4. Patient will identify two changes they are willing to make to overcome these obstacles:   Summary of Patient Progress Paul Mejia was the only participant in group, he was agreeable to speak with CSW individually. Paul Mejia shared that prior to admission he struggled with medication compliance as his Haldol injections caused muscle soreness. He shared that he also struggled with "being bored." Paul Mejia states he has been taking Haldol orally while hospitalized and is willing to take his medications this way so he doesn't have to keep getting shots; he also shared that he plans to look for a job or help out with chores around the house to keep himself busy. Paul Mejia expressed readiness to discharge home tomorrow.    Therapeutic Modalities:  Cognitive Behavioral Therapy Solution Focused Therapy Motivational  Interviewing Relapse Prevention Therapy  Stephanie Acre, MSW, [facility] 01/20/2019 1:49 PM

## 2019-01-20 NOTE — Progress Notes (Signed)
[place] Group Notes:  (Nursing/MHT/Case Management/Adjunct)  Date:  01/19/2019  Time:  2030  Type of Therapy:  wrap up group  Participation Level:  Active  Participation Quality:  Appropriate, Attentive, Sharing and Supportive  Affect:  Appropriate  Cognitive:  Alert  Insight:  Limited  Engagement in Group:  Engaged  Modes of Intervention:  Clarification, Education and Support  Summary of Progress/Problems: Pt shares he enjoys being over here on the 300 hall instead of the [address] hall. Pt reports wanting to get a place of his own because he likes living by his own rules and not someone else's rules. Pt is grateful for his 4 children.   Shellia Cleverly 01/20/2019, 12:40 AM
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
## 2019-01-21 MED ORDER — TRAZODONE HCL 100 MG PO TABS: 100.0000 mg | ORAL_TABLET | Freq: Every evening | ORAL | 0 refills | Status: AC | PRN

## 2019-01-21 MED ORDER — IBUPROFEN 600 MG PO TABS
600.0000 mg | ORAL_TABLET | Freq: Once | ORAL | Status: DC
  Filled 2019-01-21: qty 1

## 2019-01-21 MED ORDER — HALOPERIDOL 10 MG PO TABS: 10.0000 mg | ORAL_TABLET | Freq: Two times a day (BID) | ORAL | 0 refills | Status: AC

## 2019-01-21 MED ORDER — HYDROXYZINE HCL 25 MG PO TABS: 25.0000 mg | ORAL_TABLET | Freq: Four times a day (QID) | ORAL | 0 refills | Status: AC | PRN

## 2019-01-21 MED ORDER — BENZTROPINE MESYLATE 1 MG PO TABS: 1.0000 mg | ORAL_TABLET | Freq: Two times a day (BID) | ORAL | 0 refills | Status: AC

## 2019-01-21 NOTE — Progress Notes (Signed)
Discharge Note:  Patient discharged with family member.  Patient denied SI and HI.  Denied A/V hallucinations.  Suicide prevention information given and discussed with patient who stated he understood and had no questions.  Patient stated he received all his belongings, clothing, toiletries, misc items, etc.  Patient stated he appreciated all assistance received from [facility] staff.  All required discharge information given to patient at discharge.

## 2019-01-21 NOTE — Progress Notes (Signed)
D:  Patient's self inventory sheet, patient sleeps good, sleep medication helpful.  Good appetite, normal energy level, good concentration.  Denied depression and hopeless, rated anxiety #3.  Denied withdrawals.  Denied SI.  Physical problems.  Goal is discharge.  Plans to take meds.  Does have discharge plans. A:  Medications administered per MD orders.  Emotional support and encouragement given patient. R:  Denied SI and HI, contracts for safety.  Denied A/V hallucinations.  Safety maintained with 15 minute checks.

## 2019-01-21 NOTE — Discharge Summary (Signed)
Physician Discharge Summary Note  Patient:  Paul Mejia is an 58 y.o., male MRN:  [id number] DOB:  Jul 02, 1960 Patient phone:  [phone] (home)  Patient address:   [place] [address],  Total Time spent with patient: 15 minutes  Date of Admission:  01/15/2019 Date of Discharge: 01/21/19  Reason for Admission:  Hallucinations and agitation  Principal Problem: Schizophrenia, paranoid type [facility]) Discharge Diagnoses: Principal Problem:   Schizophrenia, paranoid type ([place]) Active Problems:   Acute pain of right knee   Past Psychiatric History: Has been relatively stable on low-dose haloperidol since 2017 followed by [facility] apparently there were medication changes but we are unclear what they were-currently supposed to be on haloperidol 4 mg daily with Cogentin 1 mg twice daily.  Past medication trials have included lamotrigine, carbamazepine, of course haloperidol as mentioned, olanzapine, Risperdal, quetiapine  Past Medical History:  Past Medical History:  Diagnosis Date  . Hypertension   . Schizophrenia [facility])     Past Surgical History:  Procedure Laterality Date  . APPENDECTOMY     Family History:  Family History  Problem Relation Age of Onset  . Mental illness Neg Hx    Family Psychiatric  History: Denies Social History:  Social History   Substance and Sexual Activity  Alcohol Use No     Social History   Substance and Sexual Activity  Drug Use No    Social History   Socioeconomic History  . Marital status: Single    Spouse name: Not on file  . Number of children: Not on file  . Years of education: Not on file  . Highest education level: Not on file  Occupational History  . Not on file  Social Needs  . Financial resource strain: Not on file  . Food insecurity    Worry: Not on file    Inability: Not on file  . Transportation needs    Medical: Not on file    Non-medical: Not on file  Tobacco Use  . Smoking status: Unknown If Ever  Smoked  . Smokeless tobacco: Never Used  Substance and Sexual Activity  . Alcohol use: No  . Drug use: No  . Sexual activity: Not on file  Lifestyle  . Physical activity    Days per week: Not on file    Minutes per session: Not on file  . Stress: Not on file  Relationships  . Social Herbalist on phone: Not on file    Gets together: Not on file    Attends religious service: Not on file    Active member of club or organization: Not on file    Attends meetings of clubs or organizations: Not on file    Relationship status: Not on file  Other Topics Concern  . Not on file  Social History Narrative  . Not on file    Hospital Course:  From admission H&P: This is a repeat admission at our facility for Paul Mejia, however the first since July 2017, with 1 intervening ER evaluation in 2018. He presented yesterday, on 11/11, under petition for involuntary commitment. Family members were concerned about an exacerbation in his underlying schizophrenic condition, manifest by hallucinations, seemingly fighting "imaginary people" in the yard and threatening family members.  The patient has stabilized in the past with a remarkably low levels of haloperidol, sometimes no more than 3 mg a day.  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 Further when asked about specific psychotic symptoms the patient rambles about "a figure in the corner" and seems to describe a visual hallucination but it is very vague and disjointed as far as his description, but he denies current auditory or visual hallucinations and seems to be describing a recent phenomena further he denies thoughts of harming himself or others.  He has no involuntary movements.  Is fully cordial and cooperative with the interview.  Paul Mejia was admitted for psychosis with agitation. He presented with hallucinations and delusions about being in the [employer]. He remained on the [facility] unit for six days. Haldol was increased to 10 mg BID. Trazodone was started for insomnia. He participated in group therapy on the unit. He responded well to treatment with no adverse effects reported. He has shown improved mood, affect, sleep, and interaction. He showed no agitated or disruptive behaviors on the unit. On day of discharge he denies any SI/HI/AVH and contracts for safety. Regarding visual hallucinations prior to admission, he reports "I know that was all in my head now." No delusional thought content expressed. His mother requested patient be restarted on Haldol Decanoate, but patient refuses IM injection due to reported history of leg pain with IM Haldol. He states intent to maintain medication compliance on oral Haldol. He is discharging on the medications listed below. He agrees to follow up at [facility] (see below). Patient is provided with prescriptions for medications upon discharge. His family friend is picking him up for discharge home.  Physical Findings: AIMS: Facial and Oral Movements Muscles of Facial Expression: None, normal Lips and Perioral Area: None, normal Jaw: None, normal Tongue: None, normal,Extremity Movements Upper (arms, wrists, hands, fingers): None, normal Lower (legs, knees, ankles, toes): None, normal, Trunk Movements Neck, shoulders, hips: None, normal, Overall Severity Severity of abnormal movements (highest score from questions above): None, normal Incapacitation due to abnormal movements: None, normal Patient's awareness of abnormal movements (rate only patient's report): No Awareness, Dental Status Current problems with teeth and/or dentures?: No Does patient usually wear dentures?: No  CIWA:  CIWA-Ar Total: 0 COWS:     Musculoskeletal: Strength & Muscle Tone: within normal limits Gait & Station: normal Patient leans: N/A  Psychiatric Specialty Exam: Physical Exam  Nursing note and vitals  reviewed. Constitutional: He is oriented to person, place, and time. He appears well-developed and well-nourished.  Cardiovascular: Normal rate.  Respiratory: Effort normal.  Neurological: He is alert and oriented to person, place, and time.    Review of Systems  Constitutional: Negative.   Respiratory: Negative for cough and shortness of breath.   Cardiovascular: Negative for chest pain.  Gastrointestinal: Negative for nausea and vomiting.  Neurological: Negative for headaches.  Psychiatric/Behavioral: Negative for depression, hallucinations, substance abuse and suicidal ideas. The patient is not nervous/anxious and does not have insomnia.     Blood pressure (!) 132/107, pulse 88, temperature 97.6 F (36.4 C), temperature source Oral, resp. rate 20, height 5\' 7"  (1.702 m), weight 72.1 kg, SpO2 99 %.Body mass index is 24.9 kg/m.  See MD's discharge SRA    Have you used any form of tobacco in the last 30 days? (Cigarettes, Smokeless Tobacco, Cigars, and/or Pipes): No  Has this patient used any form of tobacco in the last 30 days? (Cigarettes, Smokeless Tobacco, Cigars, and/or Pipes)  No  Blood Alcohol level:  Lab Results  Component Value Date   ETH <10 01/16/2019   ETH <5 04/06/2016  Metabolic Disorder Labs:  Lab Results  Component Value Date   HGBA1C 5.6 01/15/2019   MPG 114.02 01/15/2019   MPG 117 09/10/2015   Lab Results  Component Value Date   PROLACTIN 14.3 01/16/2019   PROLACTIN 20.6 (H) 09/10/2015   Lab Results  Component Value Date   CHOL 206 (H) 01/16/2019   TRIG 60 01/16/2019   HDL 36 (L) 01/16/2019   CHOLHDL 5.7 01/16/2019   VLDL 12 01/16/2019   LDLCALC 158 (H) 01/16/2019   LDLCALC 142 (H) 09/10/2015    See Psychiatric Specialty Exam and Suicide Risk Assessment completed by Attending Physician prior to discharge.  Discharge destination:  Home  Is patient on multiple antipsychotic therapies at discharge:  No   Has Patient had three or more failed  trials of antipsychotic monotherapy by history:  No  Recommended Plan for Multiple Antipsychotic Therapies: NA  Discharge Instructions    Discharge instructions   Complete by: As directed    Patient is instructed to take all prescribed medications as recommended. Report any side effects or adverse reactions to your outpatient psychiatrist. Patient is instructed to abstain from alcohol and illegal drugs while on prescription medications. In the event of worsening symptoms, patient is instructed to call the crisis hotline, 911, or go to the nearest emergency department for evaluation and treatment.     Allergies as of 01/21/2019   No Known Allergies     Medication List    TAKE these medications     Indication  benztropine 1 MG tablet Commonly known as: COGENTIN Take 1 tablet (1 mg total) by mouth 2 (two) times daily.  Indication: Extrapyramidal Reaction caused by Medications   haloperidol 10 MG tablet Commonly known as: HALDOL Take 1 tablet (10 mg total) by mouth 2 (two) times daily. What changed:   medication strength  how much to take  Indication: Psychosis   hydrOXYzine 25 MG tablet Commonly known as: ATARAX/VISTARIL Take 1 tablet (25 mg total) by mouth every 6 (six) hours as needed for anxiety.  Indication: Feeling Anxious   traZODone 100 MG tablet Commonly known as: DESYREL Take 1 tablet (100 mg total) by mouth at bedtime as needed for sleep. What changed:   when to take this  reasons to take this  Indication: Trouble Sleeping      Follow-up Information    [facility] Follow up on 01/22/2019.   Why: You are scheduled for an appointment with Dr. Merlyn AlbertFred on Wednesday, November 18th at 10:15am.  Please contact [facility] once discharged to let them know if you prefer this appointment to be in person or by video. Contact information: [address], [place], [place] [address]  P: [phone] F: [phone]          Follow-up recommendations: Activity as  tolerated. Diet as recommended by primary care physician. Keep all scheduled follow-up appointments as recommended.   Comments:   Patient is instructed to take all prescribed medications as recommended. Report any side effects or adverse reactions to your outpatient psychiatrist. Patient is instructed to abstain from alcohol and illegal drugs while on prescription medications. In the event of worsening symptoms, patient is instructed to call the crisis hotline, 911, or go to the nearest emergency department for evaluation and treatment.  Signed: Aldean BakerJanet E Haelee Bolen, NP 01/21/2019, 9:53 AM

## 2019-01-21 NOTE — [facility] Suicide Risk Assessment (Signed)
[facility] Discharge Suicide Risk Assessment   Principal Problem: Schizophrenia, paranoid type ([place]) Discharge Diagnoses: Principal Problem:   Schizophrenia, paranoid type ([place]) Active Problems:   Acute pain of right knee   Total Time spent with patient: 20 minutes  Musculoskeletal: Strength & Muscle Tone: within normal limits Gait & Station: normal Patient leans: N/A  Psychiatric Specialty Exam: Review of Systems  All other systems reviewed and are negative.   Blood pressure (!) 132/107, pulse 88, temperature 97.6 F (36.4 C), temperature source Oral, resp. rate 20, height 5\' 7"  (1.702 m), weight 72.1 kg, SpO2 99 %.Body mass index is 24.9 kg/m.  General Appearance: Casual  Eye Contact::  Fair  Speech:  Normal Rate409  Volume:  Normal  Mood:  Euthymic  Affect:  Congruent  Thought Process:  Coherent and Descriptions of Associations: Intact  Orientation:  Full (Time, Place, and Person)  Thought Content:  Logical  Suicidal Thoughts:  No  Homicidal Thoughts:  No  Memory:  Immediate;   Fair Recent;   Fair Remote;   Fair  Judgement:  Intact  Insight:  Fair  Psychomotor Activity:  Normal  Concentration:  Fair  Recall:  [employer] of Knowledge:Fair  Language: Good  Akathisia:  Negative  Handed:  Right  AIMS (if indicated):     Assets:  Communication Skills Desire for Improvement Housing Resilience Social Support  Sleep:  Number of Hours: 4.25  Cognition: WNL  ADL's:  Intact   Mental Status Per Nursing Assessment::   On Admission:  NA  Demographic Factors:  Male, Low socioeconomic status and Unemployed  Loss Factors: NA  Historical Factors: Impulsivity  Risk Reduction Factors:   Sense of responsibility to family, Religious beliefs about death, Living with another person, especially a relative and Positive social support  Continued Clinical Symptoms:  Schizophrenia:   Paranoid or undifferentiated type  Cognitive Features That Contribute To Risk:  None     Suicide Risk:  Minimal: No identifiable suicidal ideation.  Patients presenting with no risk factors but with morbid ruminations; may be classified as minimal risk based on the severity of the depressive symptoms  Follow-up Information    [facility] Follow up on 01/22/2019.   Why: You are scheduled for an appointment with Dr. Josph Macho on Wednesday, November 18th at 10:15am.  Please contact [facility] once discharged to let them know if you prefer this appointment to be in person or by video. Contact information:  [address] [place], [place] [address]  P: [phone] F: [phone]          Plan Of Care/Follow-up recommendations:  Activity:  ad lib  Sharma Covert, MD 01/21/2019, 8:59 AM

## 2019-01-21 NOTE — Progress Notes (Signed)
  [facility] Adult Case Management Discharge Plan :  Will you be returning to the same living situation after discharge:  Yes,  patient is returning home with his mother At discharge, do you have transportation home?: Yes,  patient reports his mother is picking him up Do you have the ability to pay for your medications: Yes,  Medicare  Release of information consent forms completed and in the chart;  Patient's signature needed at discharge.  Patient to Follow up at: Follow-up Information    [facility] Follow up on 01/22/2019.   Why: You are scheduled for an appointment with Dr. Josph Macho on Wednesday, November 18th at 10:15am.  Please contact [facility] once discharged to let them know if you prefer this appointment to be in person or by video. Contact information:  [address] [place], [place] [address]  P: [phone] F: [phone]          Next level of care provider has access to [place] and Suicide Prevention discussed: Yes,  with the patient's mother  Have you used any form of tobacco in the last 30 days? (Cigarettes, Smokeless Tobacco, Cigars, and/or Pipes): No  Has patient been referred to the Quitline?: N/A patient is not a smoker  Patient has been referred for addiction treatment: N/A  Marylee Floras, [place] 01/21/2019, 9:00 AM

## 2019-02-21 ENCOUNTER — Other Ambulatory Visit: Payer: Self-pay | Admitting: Family Medicine

## 2019-02-21 ENCOUNTER — Ambulatory Visit
Admission: RE | Admit: 2019-02-21 | Discharge: 2019-02-21 | Disposition: A | Discharge status: Home / Self Care | Payer: Medicare Other | Source: Ambulatory Visit | Attending: Family Medicine | Admitting: Family Medicine

## 2019-02-21 DIAGNOSIS — M25561 Pain in right knee: Secondary | ICD-10-CM

## 2020-02-09 IMAGING — DX DG KNEE COMPLETE 4+V*R*
4 series · 4 of 4 positions shown · non-contrast
Comparison: None.

CLINICAL DATA: Right knee pain after fall 3 weeks ago.

EXAM:
RIGHT KNEE - COMPLETE 4+ VIEW

[dg knee complete 4 views right (1 of 4)]
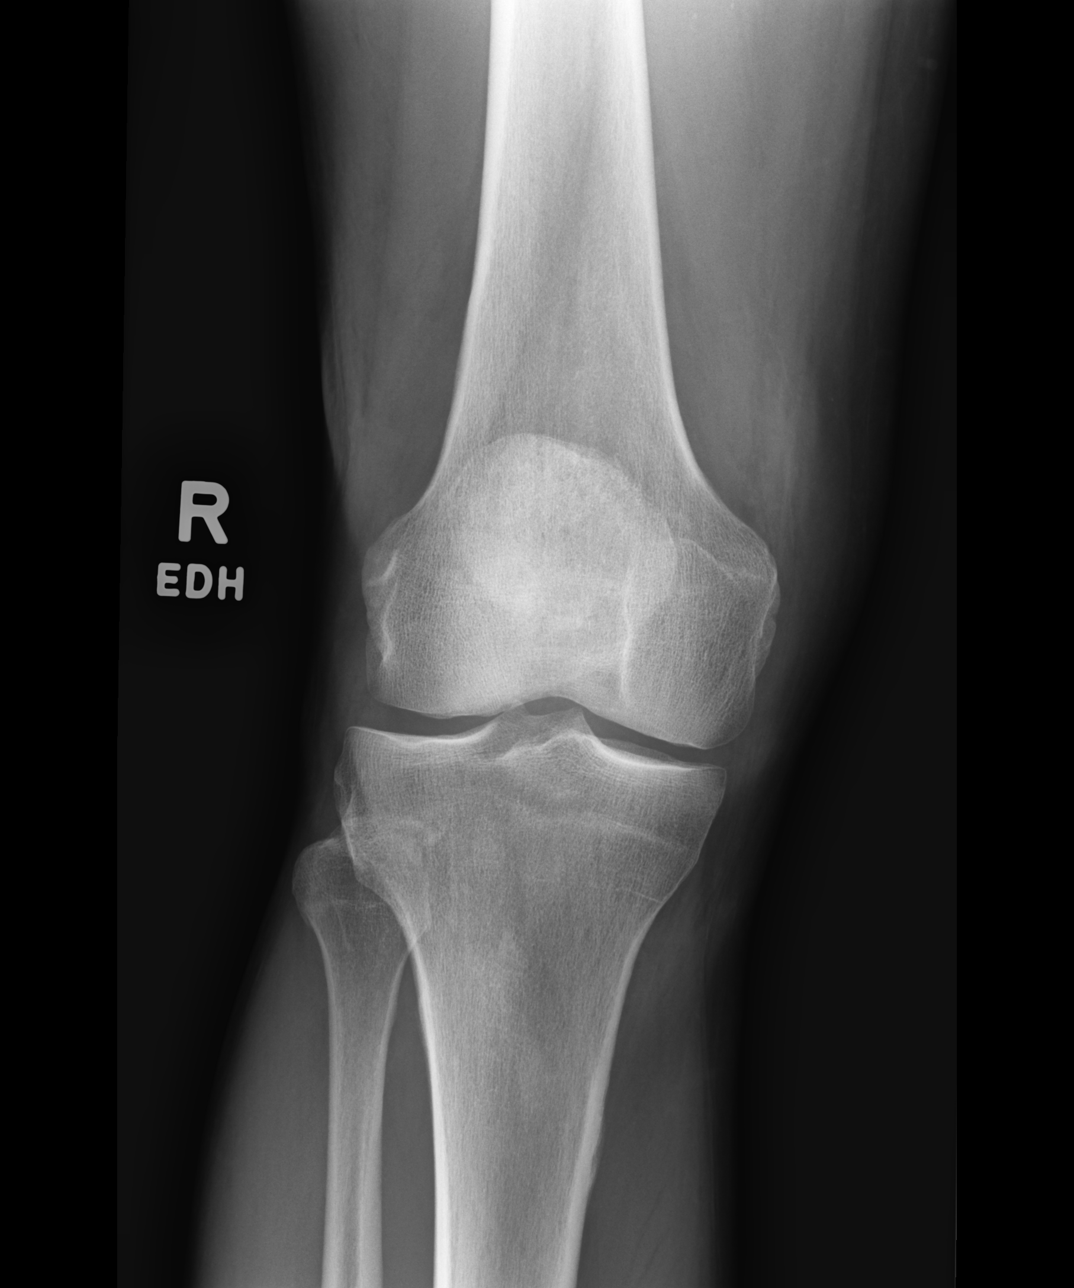

[dg knee complete 4 views right (2 of 4)]
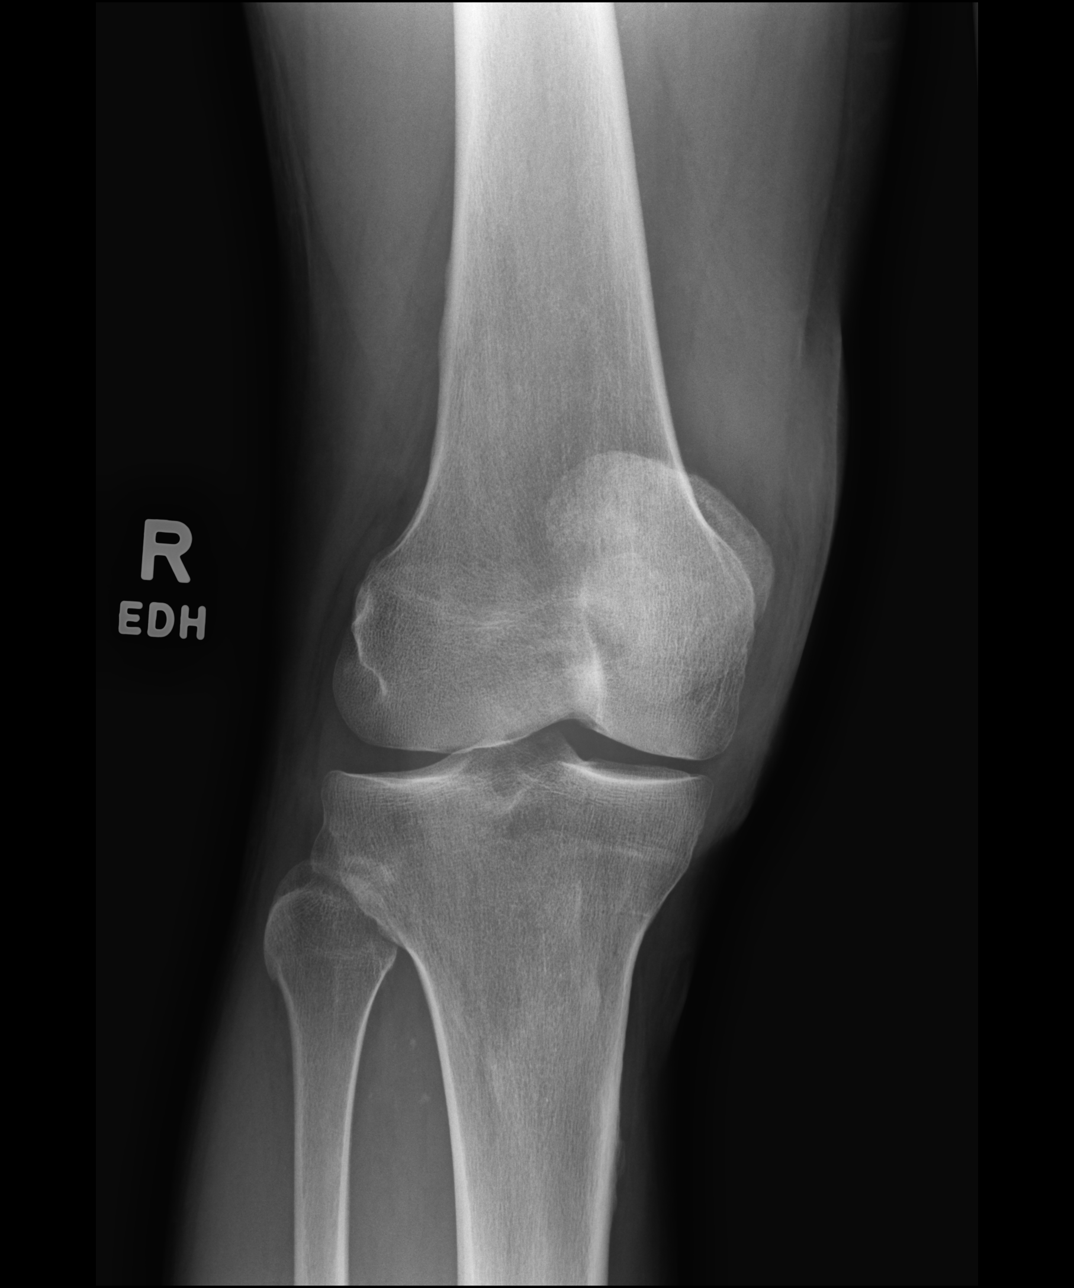

[dg knee complete 4 views right (3 of 4)]
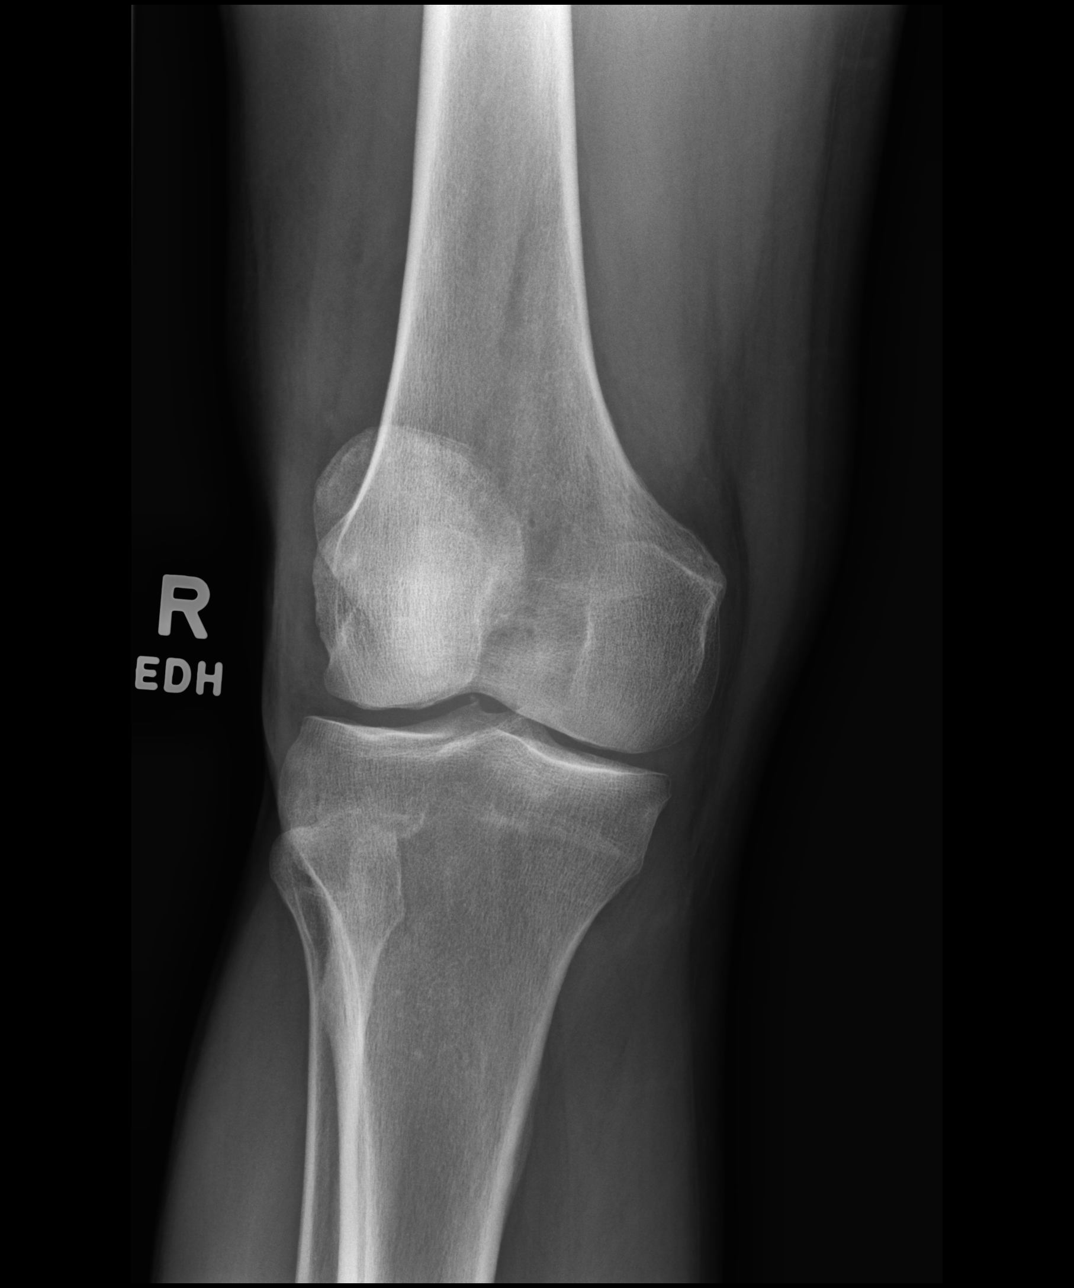

[dg knee complete 4 views right (4 of 4)]
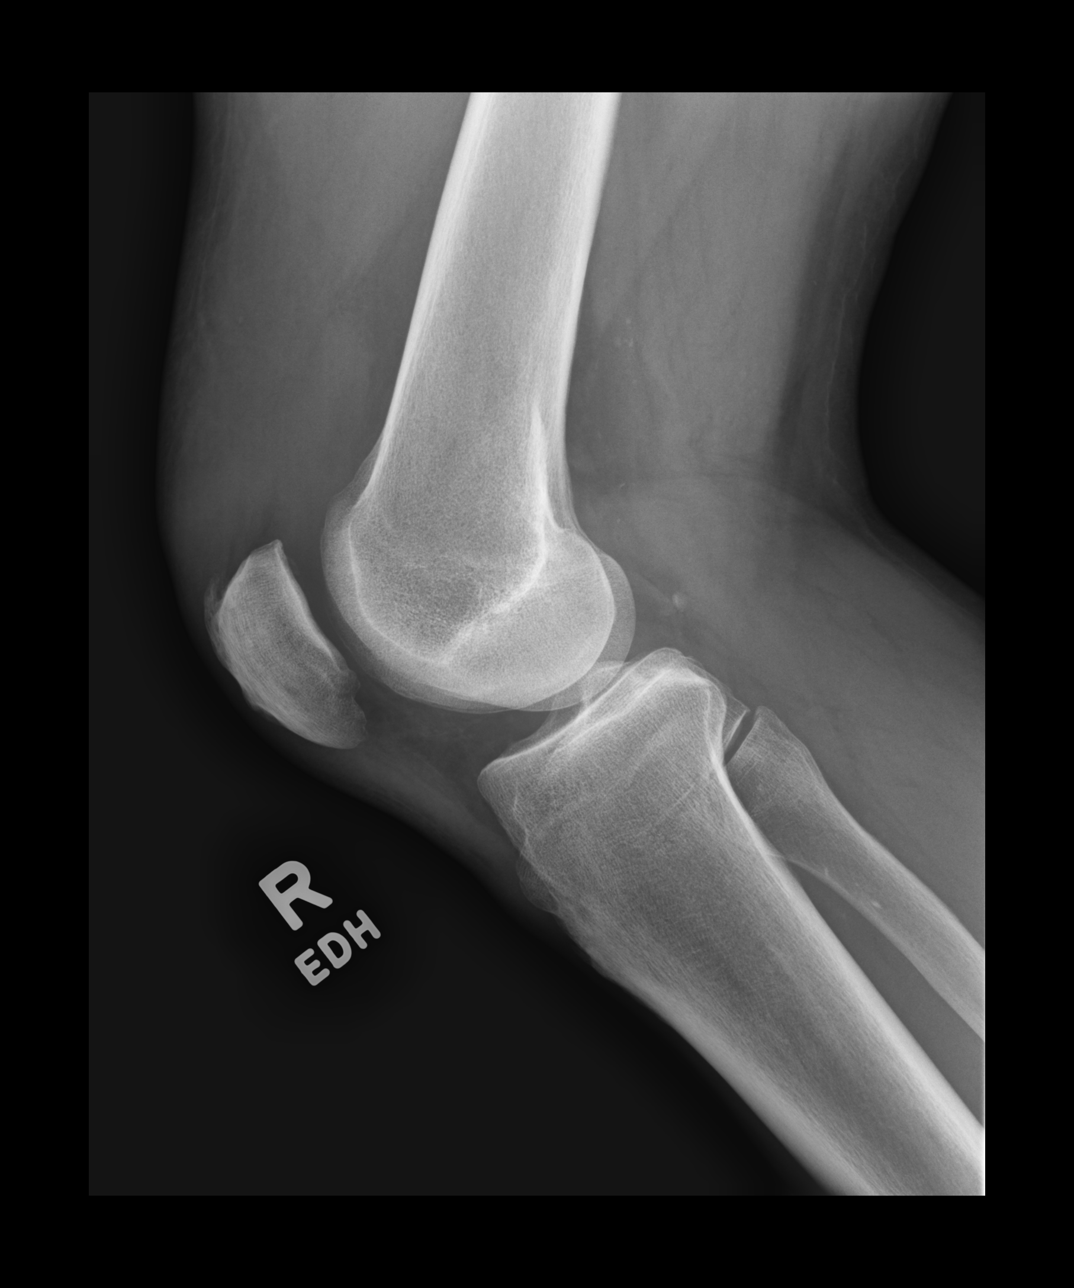

[4 of 4 positions shown; findings below may reference images not displayed]

FINDINGS: No evidence of acute fracture or dislocation. Findings suggesting
small joint effusion.
IMPRESSION: No acute fracture.
